# Patient Record
Sex: Female | Born: 1950 | Race: White | Hispanic: No | Marital: Married | Smoking: Former smoker
Health system: Southern US, Community
[De-identification: ages and names within clinical notes are randomized; demographics above are authoritative.]

## PROBLEM LIST (undated history)

## (undated) DIAGNOSIS — E65 Localized adiposity: Secondary | ICD-10-CM

## (undated) DIAGNOSIS — M810 Age-related osteoporosis without current pathological fracture: Secondary | ICD-10-CM

## (undated) DIAGNOSIS — I10 Essential (primary) hypertension: Secondary | ICD-10-CM

## (undated) DIAGNOSIS — E7800 Pure hypercholesterolemia, unspecified: Secondary | ICD-10-CM

## (undated) DIAGNOSIS — R0789 Other chest pain: Secondary | ICD-10-CM

## (undated) DIAGNOSIS — K219 Gastro-esophageal reflux disease without esophagitis: Secondary | ICD-10-CM

## (undated) DIAGNOSIS — M4316 Spondylolisthesis, lumbar region: Secondary | ICD-10-CM

## (undated) DIAGNOSIS — M5412 Radiculopathy, cervical region: Secondary | ICD-10-CM

## (undated) DIAGNOSIS — C4372 Malignant melanoma of left lower limb, including hip: Secondary | ICD-10-CM

## (undated) DIAGNOSIS — J45909 Unspecified asthma, uncomplicated: Secondary | ICD-10-CM

## (undated) DIAGNOSIS — M1990 Unspecified osteoarthritis, unspecified site: Secondary | ICD-10-CM

## (undated) HISTORY — PX: PARATHYROIDECTOMY: SHX19

## (undated) HISTORY — PX: ABDOMINAL HYSTERECTOMY: SHX81

## (undated) HISTORY — PX: LASIK: SHX215

## (undated) HISTORY — PX: BACK SURGERY: SHX140

## (undated) HISTORY — PX: HAND SURGERY: SHX662

## (undated) HISTORY — PX: CHOLECYSTECTOMY: SHX55

---

## 1995-07-13 HISTORY — PX: LAPAROSCOPIC HYSTERECTOMY: SHX1926

## 1998-07-12 DIAGNOSIS — M1990 Unspecified osteoarthritis, unspecified site: Secondary | ICD-10-CM | POA: Insufficient documentation

## 2007-08-22 ENCOUNTER — Ambulatory Visit: Payer: Self-pay | Admitting: Gastroenterology

## 2008-07-12 HISTORY — PX: GASTRIC ROUX-EN-Y: SHX5262

## 2010-07-12 HISTORY — PX: ANTERIOR FUSION CERVICAL SPINE: SUR626

## 2012-12-08 DIAGNOSIS — M542 Cervicalgia: Secondary | ICD-10-CM | POA: Insufficient documentation

## 2013-05-23 ENCOUNTER — Ambulatory Visit: Payer: Self-pay | Admitting: Family Medicine

## 2013-07-12 DIAGNOSIS — M5416 Radiculopathy, lumbar region: Secondary | ICD-10-CM | POA: Insufficient documentation

## 2014-05-12 DIAGNOSIS — G5702 Lesion of sciatic nerve, left lower limb: Secondary | ICD-10-CM | POA: Insufficient documentation

## 2015-06-02 DIAGNOSIS — I10 Essential (primary) hypertension: Secondary | ICD-10-CM | POA: Insufficient documentation

## 2015-06-02 DIAGNOSIS — G8929 Other chronic pain: Secondary | ICD-10-CM | POA: Insufficient documentation

## 2015-06-02 DIAGNOSIS — Z9884 Bariatric surgery status: Secondary | ICD-10-CM | POA: Insufficient documentation

## 2015-07-13 HISTORY — PX: CARPAL TUNNEL RELEASE: SHX101

## 2016-07-12 HISTORY — PX: CHOLECYSTECTOMY: SHX55

## 2017-06-10 ENCOUNTER — Other Ambulatory Visit: Payer: Self-pay | Admitting: Family Medicine

## 2017-06-10 DIAGNOSIS — R1310 Dysphagia, unspecified: Secondary | ICD-10-CM

## 2017-06-14 ENCOUNTER — Ambulatory Visit
Admission: RE | Admit: 2017-06-14 | Discharge: 2017-06-14 | Disposition: A | Discharge status: Home / Self Care | Payer: Medicare Other | Source: Ambulatory Visit | Attending: Family Medicine | Admitting: Family Medicine

## 2017-06-14 DIAGNOSIS — R1310 Dysphagia, unspecified: Secondary | ICD-10-CM | POA: Diagnosis not present

## 2017-12-15 HISTORY — PX: ESOPHAGOGASTRODUODENOSCOPY: SHX1529

## 2018-04-03 DIAGNOSIS — R0789 Other chest pain: Secondary | ICD-10-CM | POA: Insufficient documentation

## 2018-07-12 HISTORY — PX: ANKLE SURGERY: SHX546

## 2018-08-15 DIAGNOSIS — M7662 Achilles tendinitis, left leg: Secondary | ICD-10-CM | POA: Insufficient documentation

## 2018-08-15 DIAGNOSIS — M21862 Other specified acquired deformities of left lower leg: Secondary | ICD-10-CM | POA: Insufficient documentation

## 2019-04-17 DIAGNOSIS — L574 Cutis laxa senilis: Secondary | ICD-10-CM | POA: Insufficient documentation

## 2019-04-17 DIAGNOSIS — R634 Abnormal weight loss: Secondary | ICD-10-CM | POA: Insufficient documentation

## 2019-04-17 DIAGNOSIS — E65 Localized adiposity: Secondary | ICD-10-CM | POA: Insufficient documentation

## 2019-07-11 ENCOUNTER — Ambulatory Visit (INDEPENDENT_AMBULATORY_CARE_PROVIDER_SITE_OTHER): Payer: Medicare Other | Admitting: Podiatry

## 2019-07-11 ENCOUNTER — Other Ambulatory Visit: Payer: Self-pay

## 2019-07-11 DIAGNOSIS — L603 Nail dystrophy: Secondary | ICD-10-CM

## 2019-07-11 DIAGNOSIS — M810 Age-related osteoporosis without current pathological fracture: Secondary | ICD-10-CM | POA: Insufficient documentation

## 2019-07-11 NOTE — Progress Notes (Signed)
  Subjective:  Patient ID: Stephanie Jennings, female    DOB: 1951/07/11,  MRN: [id number] HPI No chief complaint on file.   68 y.o. female presents with the above complaint.   ROS: Denies fever chills nausea vomiting muscle aches pains calf pain back pain chest pain shortness of breath.  No past medical history on file.   Current Outpatient Medications:  .  amLODipine (NORVASC) 5 MG tablet, Take by mouth., Disp: , Rfl:  .  BIOTIN PO, Take by mouth., Disp: , Rfl:  .  Calcium Carbonate-Vitamin D (CALCIUM-VITAMIN D3 PO), Take by mouth., Disp: , Rfl:  .  Cyanocobalamin (VITAMIN B-12 PO), Take by mouth., Disp: , Rfl:  .  cycloSPORINE (RESTASIS) 0.05 % ophthalmic emulsion, 1 drop 2 (two) times daily., Disp: , Rfl:  .  denosumab (PROLIA) 60 MG/ML SOSY injection, Inject 60 mg into the skin every 6 (six) months., Disp: , Rfl:  .  Liraglutide -Weight Management (SAXENDA) 18 MG/3ML SOPN, , Disp: , Rfl:  .  methocarbamol (ROBAXIN) 750 MG tablet, Take 750 mg by mouth 4 (four) times daily., Disp: , Rfl:  .  Multiple Vitamin (MULTIVITAMIN) capsule, Take 1 capsule by mouth daily., Disp: , Rfl:  .  omeprazole (PRILOSEC) 20 MG capsule, TAKE 1 CAPSULE DAILY, Disp: , Rfl:  .  potassium chloride (KLOR-CON) 10 MEQ tablet, Take by mouth., Disp: , Rfl:  .  pregabalin (LYRICA) 100 MG capsule, Take by mouth., Disp: , Rfl:  .  rosuvastatin (CRESTOR) 10 MG tablet, Take by mouth., Disp: , Rfl:   Allergies  Allergen Reactions  . 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  General: Well developed, nourished, in no acute distress, alert and oriented x3   Dermatological: Skin is warm, dry and supple bilateral. Nails x 10 are well maintained; remaining integument appears unremarkable at this time. There are no open sores, no preulcerative lesions, no rash or signs of infection present.  Thick yellow dystrophic possibly mycotic nail plates hallux  bilateral  Vascular: Dorsalis Pedis artery and Posterior Tibial artery pedal pulses are 2/4 bilateral with immedate capillary fill time. Pedal hair growth present. No varicosities and no lower extremity edema present bilateral.   Neruologic: Grossly intact via light touch bilateral. Vibratory intact via tuning fork bilateral. Protective threshold with Semmes Wienstein monofilament intact to all pedal sites bilateral. Patellar and Achilles deep tendon reflexes 2+ bilateral. No Babinski or clonus noted bilateral.   Musculoskeletal: No gross boney pedal deformities bilateral. No pain, crepitus, or limitation noted with foot and ankle range of motion bilateral. Muscular strength 5/5 in all groups tested bilateral.  Gait: Unassisted, Nonantalgic.    Radiographs:  None taken  Assessment & Plan:   Assessment: Cannot rule out nail dystrophy or onychomycosis.  Plan: Samples of the nail were taken today to be sent for pathologic evaluation     Gailen Venne T. [place], [place]

## 2019-07-27 ENCOUNTER — Telehealth: Payer: Self-pay | Admitting: *Deleted

## 2019-07-27 NOTE — Telephone Encounter (Signed)
I informed pt of Dr. Geryl Rankins review of results and pt states understanding and will be in office to discuss care of toenails that are lifting up.

## 2019-07-27 NOTE — Telephone Encounter (Signed)
-----   Message from Elinor Parkinson, [place] sent at 07/26/2019 12:05 PM EST ----- Negative for fungus.

## 2019-08-15 ENCOUNTER — Ambulatory Visit: Payer: Medicare Other | Admitting: Podiatry

## 2019-09-05 ENCOUNTER — Ambulatory Visit (INDEPENDENT_AMBULATORY_CARE_PROVIDER_SITE_OTHER): Payer: Medicare Other | Admitting: Podiatry

## 2019-09-05 ENCOUNTER — Other Ambulatory Visit: Payer: Self-pay

## 2019-09-05 DIAGNOSIS — L603 Nail dystrophy: Secondary | ICD-10-CM

## 2019-09-05 NOTE — Progress Notes (Signed)
She presents today to discuss findings from her nail pathology.  Objective: Vital signs are stable alert and oriented x3 she is recovering from a retrocalcaneal heel spur resection performed by orthopedics.  No pathology report demonstrates no fungus no melanotic tissue.  Assessment: Negative onychomycosis.  Plan: Follow-up with me on an as-needed basis for onychocryptosis ingrown toenails.

## 2019-12-18 DIAGNOSIS — M5412 Radiculopathy, cervical region: Secondary | ICD-10-CM | POA: Insufficient documentation

## 2019-12-18 DIAGNOSIS — J386 Stenosis of larynx: Secondary | ICD-10-CM | POA: Insufficient documentation

## 2020-04-28 ENCOUNTER — Other Ambulatory Visit: Payer: Self-pay | Admitting: Otolaryngology

## 2020-04-28 DIAGNOSIS — H9123 Sudden idiopathic hearing loss, bilateral: Secondary | ICD-10-CM

## 2020-05-13 ENCOUNTER — Ambulatory Visit
Admission: RE | Admit: 2020-05-13 | Discharge: 2020-05-13 | Disposition: A | Discharge status: Home / Self Care | Payer: Medicare Other | Source: Ambulatory Visit | Attending: Otolaryngology | Admitting: Otolaryngology

## 2020-05-13 ENCOUNTER — Other Ambulatory Visit: Payer: Self-pay

## 2020-05-13 DIAGNOSIS — H9123 Sudden idiopathic hearing loss, bilateral: Secondary | ICD-10-CM | POA: Insufficient documentation

## 2020-05-13 MED ORDER — GADOBUTROL 1 MMOL/ML IV SOLN
8.0000 mL | Freq: Once | INTRAVENOUS | Status: AC | PRN
  Administered 2020-05-13: 8 mL via INTRAVENOUS

## 2021-02-17 ENCOUNTER — Encounter: Payer: Self-pay | Admitting: Internal Medicine

## 2021-02-18 ENCOUNTER — Encounter: Payer: Self-pay | Admitting: Internal Medicine

## 2021-02-18 ENCOUNTER — Encounter
Admission: RE | Disposition: A | Discharge status: Home / Self Care | Payer: Self-pay | Source: Home / Self Care | Attending: Internal Medicine

## 2021-02-18 ENCOUNTER — Ambulatory Visit: Payer: Medicare Other | Admitting: Certified Registered Nurse Anesthetist

## 2021-02-18 ENCOUNTER — Ambulatory Visit
Admission: RE | Admit: 2021-02-18 | Discharge: 2021-02-18 | Disposition: A | Discharge status: Home / Self Care | Payer: Medicare Other | Attending: Internal Medicine | Admitting: Internal Medicine

## 2021-02-18 DIAGNOSIS — E669 Obesity, unspecified: Secondary | ICD-10-CM | POA: Diagnosis not present

## 2021-02-18 DIAGNOSIS — K640 First degree hemorrhoids: Secondary | ICD-10-CM | POA: Diagnosis not present

## 2021-02-18 DIAGNOSIS — K5730 Diverticulosis of large intestine without perforation or abscess without bleeding: Secondary | ICD-10-CM | POA: Diagnosis not present

## 2021-02-18 DIAGNOSIS — Z87891 Personal history of nicotine dependence: Secondary | ICD-10-CM | POA: Diagnosis not present

## 2021-02-18 DIAGNOSIS — Z6831 Body mass index (BMI) 31.0-31.9, adult: Secondary | ICD-10-CM | POA: Diagnosis not present

## 2021-02-18 DIAGNOSIS — Z79899 Other long term (current) drug therapy: Secondary | ICD-10-CM | POA: Diagnosis not present

## 2021-02-18 DIAGNOSIS — Z1211 Encounter for screening for malignant neoplasm of colon: Secondary | ICD-10-CM | POA: Insufficient documentation

## 2021-02-18 DIAGNOSIS — Z886 Allergy status to analgesic agent status: Secondary | ICD-10-CM | POA: Insufficient documentation

## 2021-02-18 HISTORY — DX: Other chest pain: R07.89

## 2021-02-18 HISTORY — DX: Essential (primary) hypertension: I10

## 2021-02-18 HISTORY — DX: Age-related osteoporosis without current pathological fracture: M81.0

## 2021-02-18 HISTORY — DX: Localized adiposity: E65

## 2021-02-18 HISTORY — PX: COLONOSCOPY WITH PROPOFOL: SHX5780

## 2021-02-18 SURGERY — COLONOSCOPY WITH PROPOFOL
Anesthesia: General

## 2021-02-18 MED ORDER — PROPOFOL 500 MG/50ML IV EMUL
INTRAVENOUS | Status: AC
  Filled 2021-02-18: qty 50

## 2021-02-18 MED ORDER — SODIUM CHLORIDE 0.9 % IV SOLN
INTRAVENOUS | Status: DC
  Administered 2021-02-18: 1000 mL via INTRAVENOUS

## 2021-02-18 MED ORDER — PROPOFOL 500 MG/50ML IV EMUL
INTRAVENOUS | Status: DC | PRN
  Administered 2021-02-18: 75 ug/kg/min via INTRAVENOUS

## 2021-02-18 MED ORDER — PROPOFOL 10 MG/ML IV BOLUS
INTRAVENOUS | Status: DC | PRN
  Administered 2021-02-18: 50 mg via INTRAVENOUS

## 2021-02-18 MED ORDER — PHENYLEPHRINE HCL (PRESSORS) 10 MG/ML IV SOLN
INTRAVENOUS | Status: AC
  Filled 2021-02-18: qty 1

## 2021-02-18 MED ORDER — LIDOCAINE HCL (CARDIAC) PF 100 MG/5ML IV SOSY
PREFILLED_SYRINGE | INTRAVENOUS | Status: DC | PRN
  Administered 2021-02-18: 50 mg via INTRAVENOUS

## 2021-02-18 MED ORDER — PROPOFOL 10 MG/ML IV BOLUS
INTRAVENOUS | Status: AC
  Filled 2021-02-18: qty 40

## 2021-02-18 NOTE — H&P (Signed)
Outpatient short stay form Pre-procedure 02/18/2021 8:28 AM Gerilynn Mccullars K. Norma Fredrickson, M.D.  Primary Physician: Jerl Mina, M.D.  Reason for visit:  Colon cancer screening  History of present illness:  Patient presents for colonoscopy for colon cancer screening. The patient denies complaints of abdominal pain, significant change in bowel habits, or rectal bleeding.      Current Facility-Administered Medications:    0.9 %  sodium chloride infusion, , Intravenous, Continuous, Riley, Boykin Nearing, MD, Last Rate: 20 mL/hr at 02/18/21 0720, 1,000 mL at 02/18/21 0720  Medications Prior to Admission  Medication Sig Dispense Refill Last Dose   BIOTIN PO Take by mouth.   02/17/2021 at 0600   Calcium Carbonate-Vitamin D (CALCIUM-VITAMIN D3 PO) Take by mouth.   02/17/2021   cycloSPORINE (RESTASIS) 0.05 % ophthalmic emulsion 1 drop 2 (two) times daily.   02/17/2021   denosumab (PROLIA) 60 MG/ML SOSY injection Inject 60 mg into the skin every 6 (six) months.   Past Week   methocarbamol (ROBAXIN) 750 MG tablet Take 750 mg by mouth 4 (four) times daily.   02/17/2021   Multiple Vitamin (MULTIVITAMIN) capsule Take 1 capsule by mouth daily.   02/17/2021   omeprazole (PRILOSEC) 20 MG capsule TAKE 1 CAPSULE DAILY   02/18/2021 at 0600   potassium chloride (KLOR-CON) 10 MEQ tablet Take by mouth.   02/18/2021 at 0600   pregabalin (LYRICA) 100 MG capsule Take by mouth.   02/18/2021 at 0600   rosuvastatin (CRESTOR) 10 MG tablet Take by mouth.   02/17/2021   Semaglutide-Weight Management 1 MG/0.5ML SOAJ Inject 1 mg into the skin once a week.   02/17/2021   amLODipine (NORVASC) 5 MG tablet Take by mouth.      buPROPion (WELLBUTRIN SR) 200 MG 12 hr tablet Take 200 mg by mouth 2 (two) times daily. (Patient not taking: Reported on 02/18/2021)   Not Taking   Cyanocobalamin (VITAMIN B-12 PO) Take by mouth. 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    Physical Exam  Gen: Alert, oriented. Appears stated age.  HEENT: [place]/AT. PERRLA. Lungs: CTA, no wheezes. CV: RR nl S1, S2. Abd: soft, benign, no masses. BS+ Ext: No edema. Pulses 2+    Planned procedures: Proceed with colonoscopy. The patient understands the nature of the planned procedure, indications, risks, alternatives and potential complications including but not limited to bleeding, infection, perforation, damage to internal organs and possible oversedation/side effects from anesthesia. The patient agrees and gives consent to proceed.  Please refer to procedure notes for findings, recommendations and patient disposition/instructions.     Hanae Waiters K. Norma Fredrickson, M.D. Gastroenterology 02/18/2021  8:28 AM

## 2021-02-18 NOTE — Transfer of Care (Signed)
Immediate Anesthesia Transfer of Care Note  Patient: Stephanie Jennings  Procedure(s) Performed: COLONOSCOPY WITH PROPOFOL  Patient Location: PACU  Anesthesia Type:General  Level of Consciousness: drowsy  Airway & Oxygen Therapy: Patient Spontanous Breathing  Post-op Assessment: Report given to RN and Post -op Vital signs reviewed and stable  Post vital signs: Reviewed and stable  Last Vitals:  Vitals Value Taken Time  BP 116/66 02/18/21 0851  Temp    Pulse 65 02/18/21 0851  Resp 14 02/18/21 0851  SpO2 99 % 02/18/21 0851    Last Pain:  Vitals:   02/18/21 0706  TempSrc: Temporal  PainSc: 0-No pain         Complications: No notable events documented.

## 2021-02-18 NOTE — Anesthesia Preprocedure Evaluation (Signed)
Anesthesia Evaluation  Patient identified by MRN, date of birth, ID band Patient awake    Reviewed: Allergy & Precautions, NPO status , Patient's Chart, lab work & pertinent test results  History of Anesthesia Complications Negative for: history of anesthetic complications  Airway Mallampati: II  TM Distance: >3 FB Neck ROM: Full    Dental no notable dental hx.    Pulmonary neg pulmonary ROS, neg sleep apnea, neg COPD, former smoker,    breath sounds clear to auscultation- rhonchi (-) wheezing      Cardiovascular Exercise Tolerance: Good hypertension, Pt. on medications (-) CAD, (-) Past MI, (-) Cardiac Stents and (-) CABG  Rhythm:Regular Rate:Normal - Systolic murmurs and - Diastolic murmurs    Neuro/Psych neg Seizures negative neurological ROS  negative psych ROS   GI/Hepatic negative GI ROS, Neg liver ROS,   Endo/Other  negative endocrine ROSneg diabetes  Renal/GU negative Renal ROS     Musculoskeletal negative musculoskeletal ROS (+)   Abdominal (+) + obese,   Peds  Hematology negative hematology ROS (+)   Anesthesia Other Findings Past Medical History: No date: Chest pain, atypical No date: Hypertension No date: Osteoporosis No date: Pannus, abdominal   Reproductive/Obstetrics                             Anesthesia Physical Anesthesia Plan  ASA: 2  Anesthesia Plan: General   Post-op Pain Management:    Induction: Intravenous  PONV Risk Score and Plan: 2 and Propofol infusion  Airway Management Planned: Natural Airway  Additional Equipment:   Intra-op Plan:   Post-operative Plan:   Informed Consent: I have reviewed the patients History and Physical, chart, labs and discussed the procedure including the risks, benefits and alternatives for the proposed anesthesia with the patient or authorized representative who has indicated his/her understanding and acceptance.      Dental advisory given  Plan Discussed with: CRNA and Anesthesiologist  Anesthesia Plan Comments:         Anesthesia Quick Evaluation

## 2021-02-18 NOTE — Op Note (Signed)
[facility] Gastroenterology Patient Name: Stephanie Jennings Procedure Date: 02/18/2021 8:29 AM MRN: [id number] Account #: [id number] Date of Birth: September 25, 1950 Admit Type: Outpatient Age: 70 Room: [facility] ENDO ROOM 2 Gender: Female Note Status: Finalized Procedure:             Colonoscopy Indications:           Screening for colorectal malignant neoplasm Providers:             Boykin Nearing. Norma Fredrickson MD, MD Referring MD:          Rhona Leavens. Burnett Sheng, MD (Referring MD) Medicines:             Propofol per Anesthesia Complications:         No immediate complications. Procedure:             Pre-Anesthesia Assessment:                        - The risks and benefits of the procedure and the                         sedation options and risks were discussed with the                         patient. All questions were answered and informed                         consent was obtained.                        - Patient identification and proposed procedure were                         verified prior to the procedure by the nurse. The                         procedure was verified in the procedure room.                        - ASA Grade Assessment: III - A patient with severe                         systemic disease.                        - After reviewing the risks and benefits, the patient                         was deemed in satisfactory condition to undergo the                         procedure.                        After obtaining informed consent, the colonoscope was                         passed under direct vision. Throughout the procedure,                         the patient's blood pressure,  pulse, and oxygen                         saturations were monitored continuously. The                         Colonoscope was introduced through the anus and                         advanced to the the cecum, identified by appendiceal                         orifice and ileocecal  valve. The colonoscopy was                         performed without difficulty. The patient tolerated                         the procedure well. The quality of the bowel                         preparation was good. The ileocecal valve, appendiceal                         orifice, and rectum were photographed. Findings:      The perianal and digital rectal examinations were normal. Pertinent       negatives include normal sphincter tone and no palpable rectal lesions.      Non-bleeding internal hemorrhoids were found during retroflexion. The       hemorrhoids were Grade I (internal hemorrhoids that do not prolapse).      Many small-mouthed diverticula were found in the sigmoid colon. There       was no evidence of diverticular bleeding.      The exam was otherwise without abnormality. Impression:            - Non-bleeding internal hemorrhoids.                        - Mild diverticulosis in the sigmoid colon. There was                         no evidence of diverticular bleeding.                        - The examination was otherwise normal.                        - No specimens collected. Recommendation:        - Patient has a contact number available for                         emergencies. The signs and symptoms of potential                         delayed complications were discussed with the patient.                         Return to normal activities tomorrow. Written  discharge instructions were provided to the patient.                        - Resume previous diet.                        - Continue present medications.                        - Repeat colonoscopy in 10 years for screening                         purposes.                        - Return to GI office PRN.                        - The findings and recommendations were discussed with                         the patient. Procedure Code(s):     --- Professional ---                         I3474, Colorectal cancer screening; colonoscopy on                         individual not meeting criteria for high risk Diagnosis Code(s):     --- Professional ---                        K57.30, Diverticulosis of large intestine without                         perforation or abscess without bleeding                        K64.0, First degree hemorrhoids                        Z12.11, Encounter for screening for malignant neoplasm                         of colon CPT copyright 2019 American Medical Association. All rights reserved. The codes documented in this report are preliminary and upon coder review may  be revised to meet current compliance requirements. Stanton Kidney MD, MD 02/18/2021 8:48:40 AM This report has been signed electronically. Number of Addenda: 0 Note Initiated On: 02/18/2021 8:29 AM Scope Withdrawal Time: 0 hours 6 minutes 3 seconds  Total Procedure Duration: 0 hours 12 minutes 3 seconds  Estimated Blood Loss:  Estimated blood loss: none.      [facility], [place]

## 2021-02-18 NOTE — Anesthesia Postprocedure Evaluation (Signed)
Anesthesia Post Note  Patient: Stephanie Jennings  Procedure(s) Performed: COLONOSCOPY WITH PROPOFOL  Patient location during evaluation: Endoscopy Anesthesia Type: General Level of consciousness: awake and alert and oriented Pain management: pain level controlled Vital Signs Assessment: post-procedure vital signs reviewed and stable Respiratory status: spontaneous breathing, nonlabored ventilation and respiratory function stable Cardiovascular status: blood pressure returned to baseline and stable Postop Assessment: no signs of nausea or vomiting Anesthetic complications: no   No notable events documented.   Last Vitals:  Vitals:   02/18/21 0900 02/18/21 0910  BP: 120/81 129/71  Pulse: 66 (!) 55  Resp: 20 15  Temp:    SpO2: 100% 100%    Last Pain:  Vitals:   02/18/21 0910  TempSrc:   PainSc: 0-No pain                 Yazan Gatling

## 2021-02-18 NOTE — Interval H&P Note (Signed)
History and Physical Interval Note:  02/18/2021 8:29 AM  Stephanie Jennings  has presented today for surgery, with the diagnosis of screening.  The various methods of treatment have been discussed with the patient and family. After consideration of risks, benefits and other options for treatment, the patient has consented to  Procedure(s): COLONOSCOPY WITH PROPOFOL (N/A) as a surgical intervention.  The patient's history has been reviewed, patient examined, no change in status, stable for surgery.  I have reviewed the patient's chart and labs.  Questions were answered to the patient's satisfaction.     [place], [place]

## 2021-02-19 ENCOUNTER — Encounter: Payer: Self-pay | Admitting: Internal Medicine

## 2021-08-12 HISTORY — PX: POSTERIOR LUMBAR FUSION: SHX6036

## 2021-08-13 DIAGNOSIS — M4316 Spondylolisthesis, lumbar region: Secondary | ICD-10-CM | POA: Insufficient documentation

## 2021-12-02 ENCOUNTER — Ambulatory Visit (LOCAL_COMMUNITY_HEALTH_CENTER): Payer: Medicare Other

## 2021-12-02 DIAGNOSIS — Z719 Counseling, unspecified: Secondary | ICD-10-CM

## 2021-12-02 DIAGNOSIS — Z23 Encounter for immunization: Secondary | ICD-10-CM | POA: Diagnosis not present

## 2021-12-02 NOTE — Progress Notes (Signed)
Patient here for COVID-19 BV booster. Reported they received Moderna BV last time but all other vaccines were Pfizer and would like [employer] today.   EUA sheet given for review.     Are you feeling sick today? No   Have you ever received a dose of COVID-19 Vaccine? [employer], [place], [place], [place], Other) Yes  If yes, which vaccine and how many doses?    4 Pfizer monovalent, 1 moderna BV   Did you bring the vaccination record card or other documentation?  Yes   Do you have a health condition or are undergoing treatment that makes you moderately or severely immunocompromised? This would include, but not be limited to: cancer, HIV, organ transplant, immunosuppressive therapy/high-dose corticosteroids, or moderate/severe primary immunodeficiency.  No, reports she has one medicine once a month, that has a chance to make immunocompromised.    Have you received COVID-19 vaccine before or during hematopoietic cell transplant (HCT) or CAR-T-cell therapies? No  Have you ever had an allergic reaction to: (This would include a severe allergic reaction or a reaction that caused hives, swelling, or respiratory distress, including wheezing.) A component of a COVID-19 vaccine or a previous dose of COVID-19 vaccine? No   Have you ever had an allergic reaction to another vaccine (other thanCOVID-19 vaccine) or an injectable medication? (This would include a severe allergic reaction or a reaction that caused hives, swelling, or respiratory distress, including wheezing.)   No    Do you have a history of any of the following:  Myocarditis or Pericarditis No  Dermal fillers:  No  Multisystem Inflammatory Syndrome (MIS-C or MIS-A)? No  COVID-19 disease within the past 3 months? No  Vaccinated with monkeypox vaccine in the last 4 weeks? No   Vaccine administered, tolerated well.  After vaccine care reviewed.  COVID-19 card given and [facility] updated with married name and address.    Hildegard Hlavac Sherrilyn Rist, RN

## 2022-02-03 IMAGING — MR MR BRAIN/IAC WO/W CM
11 of 14 series · 32 of 48 positions shown · IV contrast (gadavist)
Comparison: None.

CLINICAL DATA: One year of hearing loss, worsening for 3 months.

EXAM:
MRI HEAD WITHOUT AND WITH CONTRAST
TECHNIQUE: Multiplanar, multiecho pulse sequences of the brain and surrounding
structures were obtained without and with intravenous contrast.
CONTRAST:  8mL GADAVIST GADOBUTROL 1 MMOL/ML IV SOLN

[Series 5: T1 · sagittal · 5.0mm · 0.62mm/px · 1 of 25 slices shown (1 of 4)]
[im 1/25]
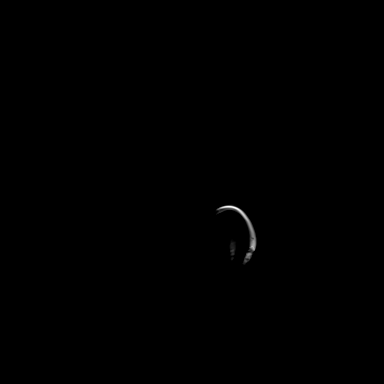

[Series 6: ax dwi_tracew · axial · 3.0mm · 0.60mm/px · z∈[-121,+32]mm · 2 of 48 slices shown]
[im 1/48]
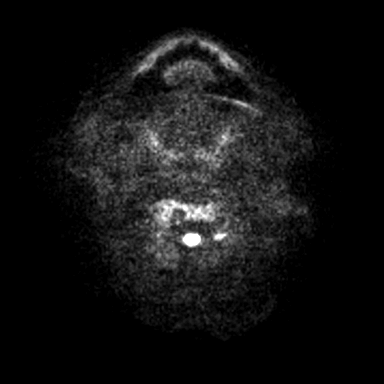
[im 48/48]
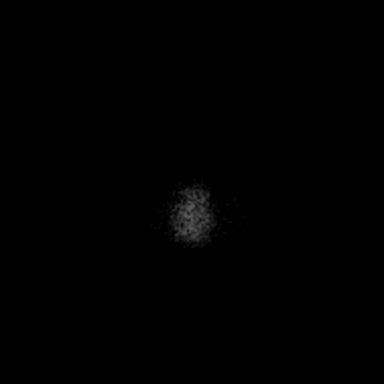

[Series 7: ax dwi_adc · axial · 3.0mm · 0.60mm/px · 1 of 48 slices shown]
[im 1/48]
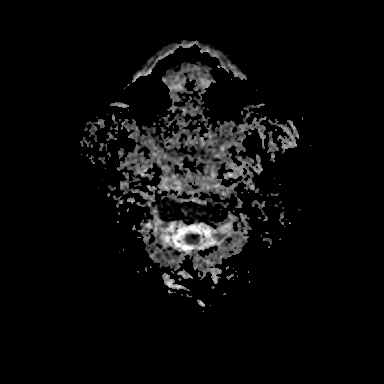

[Series 8: T2 · axial · 5.0mm · 0.53mm/px · z∈[-115,+28]mm · 2 of 25 slices shown]
[im 1/25]
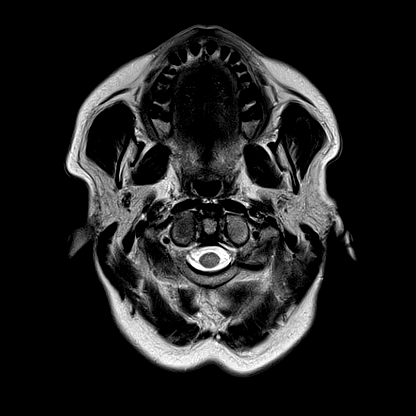
[im 25/25]
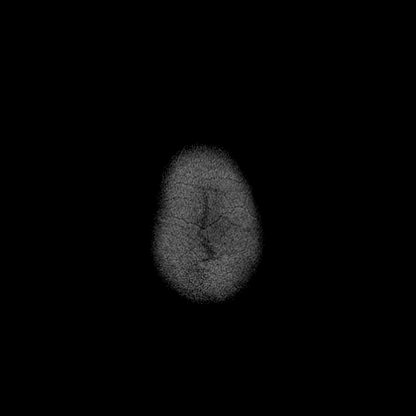

[Series 13: FLAIR · axial · 3.0mm · 0.53mm/px · z∈[-124,+37]mm · 3 of 55 slices shown]
[im 1/55]
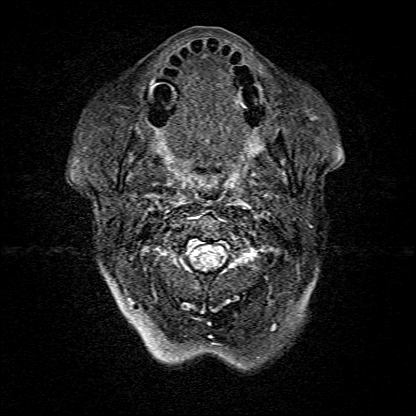
[im 28/55]
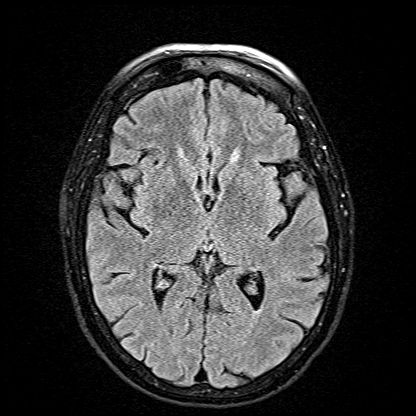
[im 55/55]
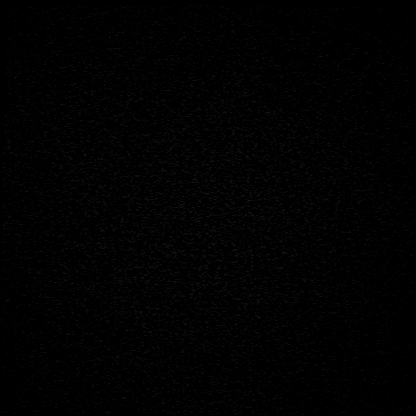

[Series 15: T1 · axial · non-contrast · 3.0mm · 0.21mm/px · 1 of 15 slices shown (2 of 4)]
[im 1/15]
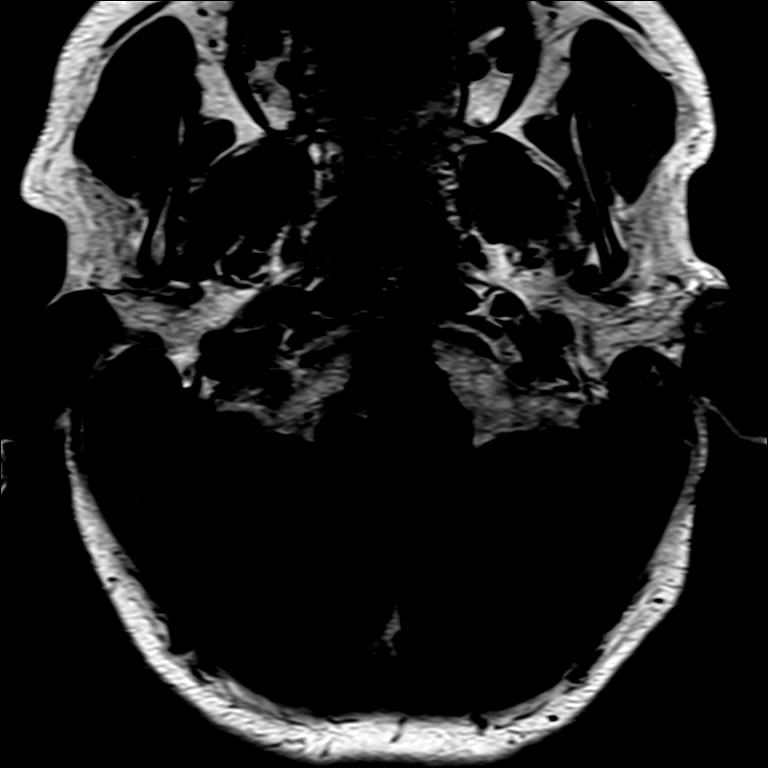

[Series 16: T1 · coronal · non-contrast · 3.0mm · 0.21mm/px · 1 of 13 slices shown (3 of 4)]
[im 1/13]
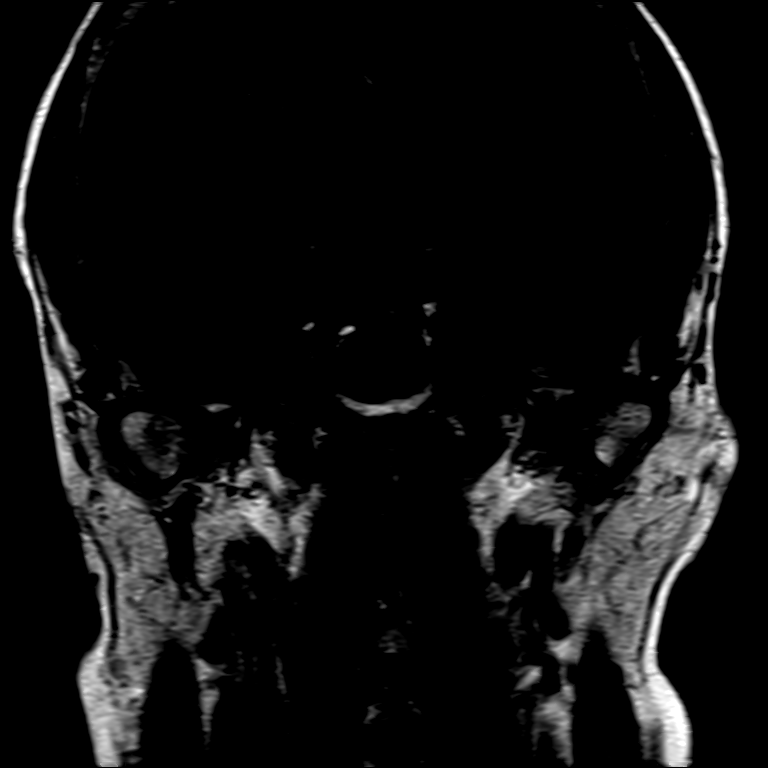

[Series 17: T1 · axial · 1.0mm · 0.98mm/px · z∈[-132,+41]mm · 8 of 176 slices shown (4 of 4)]
[im 1/176]
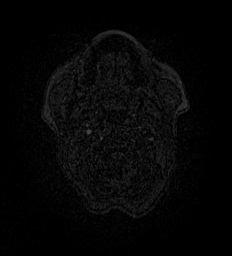
[im 36/176]
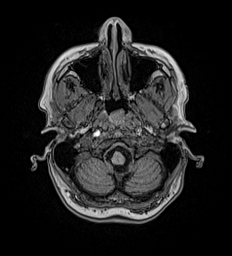
[im 53/176]
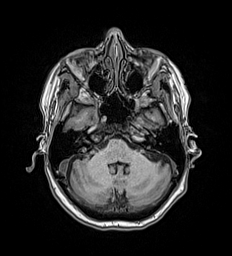
[im 71/176]
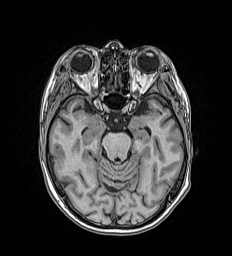
[im 106/176]
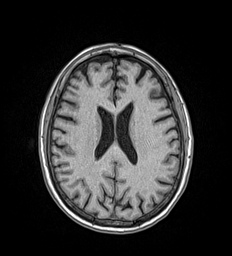
[im 123/176]
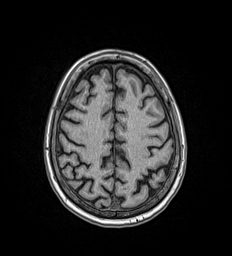
[im 141/176]
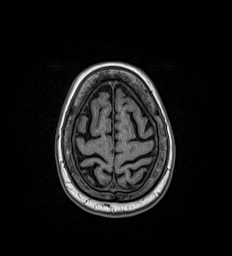
[im 176/176]
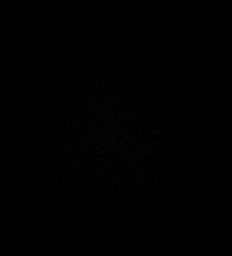

[Series 18: T1 post-contrast · axial · 3.0mm · 0.21mm/px · 1 of 15 slices shown (1 of 3)]
[im 1/15]
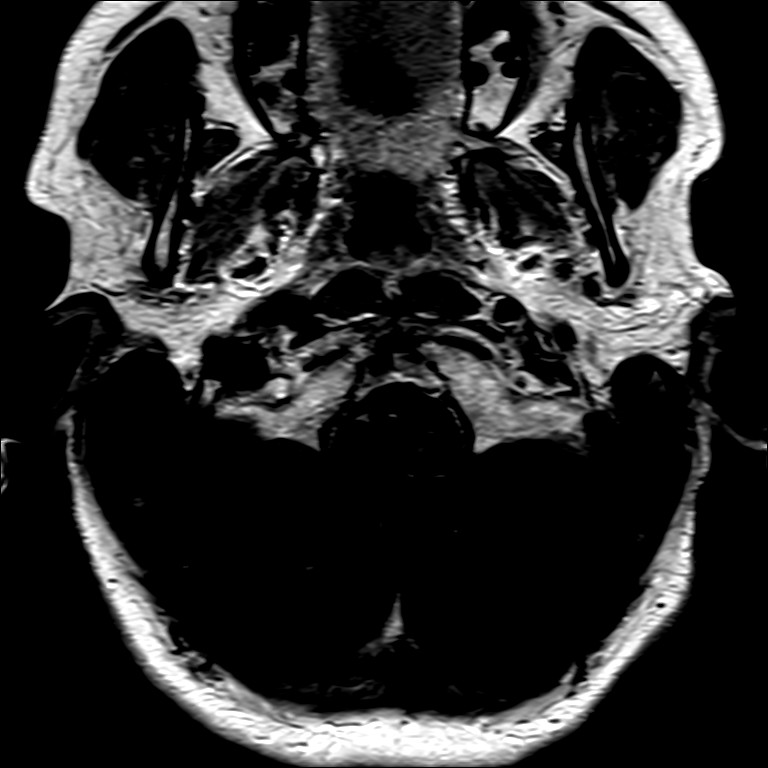

[Series 19: T1 post-contrast · coronal · 3.0mm · 0.21mm/px · 1 of 13 slices shown (2 of 3)]
[im 1/13]
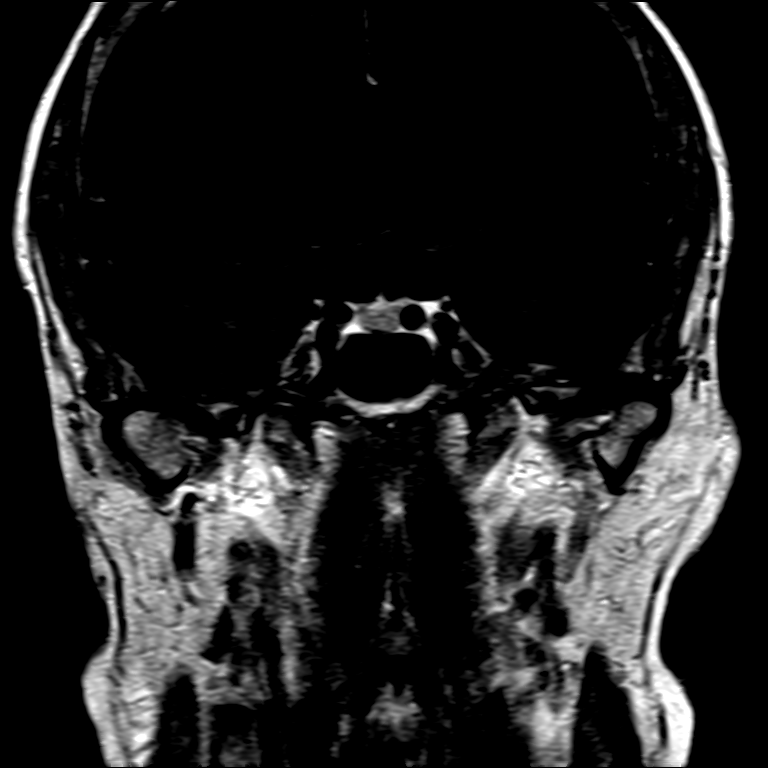

[Series 20: T1 post-contrast · axial · 1.0mm · 0.98mm/px · z∈[-132,+41]mm · 11 of 176 slices shown (3 of 3)]
[im 1/176]
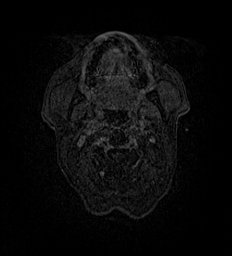
[im 18/176]
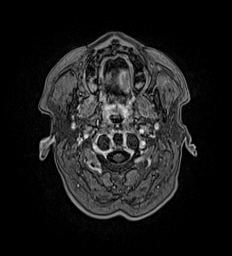
[im 36/176]
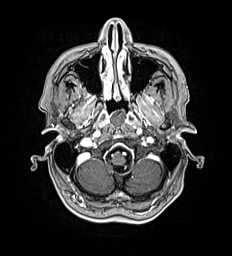
[im 53/176]
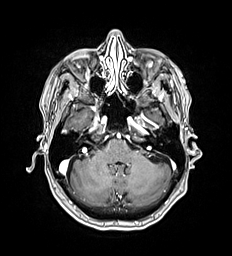
[im 71/176]
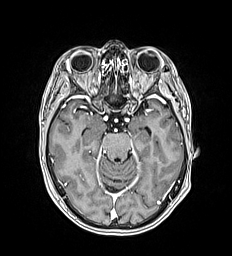
[im 88/176]
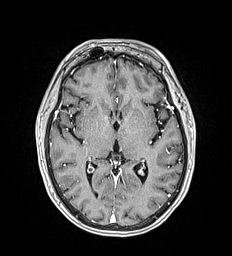
[im 106/176]
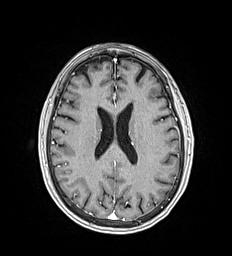
[im 123/176]
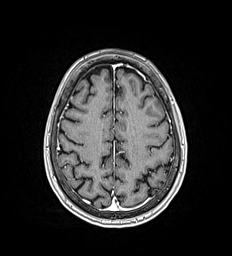
[im 141/176]
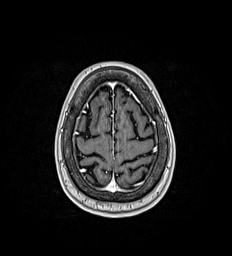
[im 158/176]
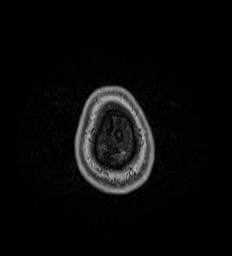
[im 176/176]
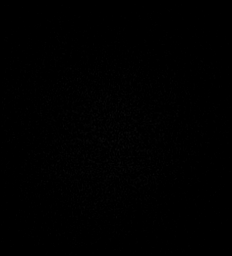

[32 of 48 positions shown; findings below may reference images not displayed]

FINDINGS: Brain: Symmetric normal labyrinthine signal. No abnormal enhancement
at the temporal bones or internal auditory canal. Negative cisterns
and brainstem.

Few remote white matter insults, commonly seen at patient age.
Normal brain volume. No hydrocephalus, collection, or mass.

Vascular: Normal flow voids and vascular enhancements. Small
diverticula at the bilateral jugular bulb, greater on the right.

Skull and upper cervical spine: Negative

Sinuses/Orbits: Negative

Other: Nonenhancing midline nasopharyngeal mass consistent with
retention cyst, 13 mm in diameter
IMPRESSION: 1. Negative for retrocochlear lesion.
2. Bilateral jugular bulb diverticulum.

## 2022-11-10 ENCOUNTER — Other Ambulatory Visit (HOSPITAL_COMMUNITY): Payer: Self-pay

## 2022-12-12 NOTE — Discharge Instructions (Signed)
Instructions after Total Hip Replacement   Belal Scallon P. Kathrynn Backstrom, Jr., M.D.    Dept. of Orthopaedics & Sports Medicine [facility] [address] [place], [place]  [address]  Phone: [phone]   Fax: [phone]        [email or web address]        DIET: Drink plenty of non-alcoholic fluids. Resume your normal diet. Include foods high in fiber.  ACTIVITY:  You may use crutches or a walker with weight-bearing as tolerated, unless instructed otherwise. You may be weaned off of the walker or crutches by your Physical Therapist.  Do NOT reach below the level of your knees or cross your legs until allowed.    Continue doing gentle exercises. Exercising will reduce the pain and swelling, increase motion, and prevent muscle weakness.   Please continue to use the TED compression stockings for 6 weeks. You may remove the stockings at night, but should reapply them in the morning. Do not drive or operate any equipment until instructed.  WOUND CARE:  Continue to use ice packs periodically to reduce pain and swelling. Keep the incision clean and dry. You may bathe or shower after the staples are removed at the first office visit following surgery.  MEDICATIONS: You may resume your regular medications. Please take the pain medication as prescribed on the medication. Do not take pain medication on an empty stomach. You have been given a prescription for a blood thinner to prevent blood clots. Please take the medication as instructed. (NOTE: After completing a 2 week course of Lovenox, take one Enteric-coated aspirin once a day.) Pain medications and iron supplements can cause constipation. Use a stool softener (Senokot or Colace) on a daily basis and a laxative (dulcolax or miralax) as needed. Do not drive or drink alcoholic beverages when taking pain medications.  CALL THE OFFICE FOR: Temperature above 101 degrees Excessive bleeding or drainage on the dressing. Excessive swelling,  coldness, or paleness of the toes. Persistent nausea and vomiting.  FOLLOW-UP:  You should have an appointment to return to the office in 6 weeks after surgery. Arrangements have been made for continuation of Physical Therapy (either home therapy or outpatient therapy).     [facility] Department Directory         [email or web address]       [email or web address]          Cardiology  Appointments: [place] - [phone] [place] - [phone]  Endocrinology  Appointments: [place] - [phone] [place] - [phone]  Gastroenterology  Appointments: [place] - [phone] [place] - [phone]        General Surgery   Appointments: [place] - [phone]  Internal Medicine/Family Medicine  Appointments: [place] - [phone] [place] - [phone] [place] - [phone]  Metabolic and Weigh Loss Surgery  Appointments: [place] - [phone]        Neurology  Appointments: [place] - [phone] [place] - [phone]  Neurosurgery  Appointments: [place] - [phone]  Obstetrics & Gynecology  Appointments: [place] - [phone] [place] - [phone]        Pediatrics  Appointments: [place] - [phone] [place] - [phone]  Physiatry  Appointments: [place] -[phone]  Physical Therapy  Appointments: [place] - [phone] [place] - [phone]        Podiatry  Appointments: [place] - [phone] [place] - [phone]  Pulmonology  Appointments: [place] - [phone]  Rheumatology  Appointments: [place] - [phone]        [place] Location: [facility]  [address] [place], [place]  [address]  [place] Location: [facility] [address] [place], [place]  [address]  [place] Location: [facility] [address] [place], [place]  [address]    

## 2022-12-14 ENCOUNTER — Encounter
Admission: RE | Admit: 2022-12-14 | Discharge: 2022-12-14 | Disposition: A | Discharge status: Home / Self Care | Payer: Medicare Other | Source: Ambulatory Visit | Attending: Orthopedic Surgery | Admitting: Orthopedic Surgery

## 2022-12-14 DIAGNOSIS — Z01818 Encounter for other preprocedural examination: Secondary | ICD-10-CM | POA: Diagnosis present

## 2022-12-14 HISTORY — DX: Malignant melanoma of left lower limb, including hip: C43.72

## 2022-12-14 HISTORY — DX: Spondylolisthesis, lumbar region: M43.16

## 2022-12-14 HISTORY — DX: Unspecified asthma, uncomplicated: J45.909

## 2022-12-14 HISTORY — DX: Essential (primary) hypertension: I10

## 2022-12-14 HISTORY — DX: Unspecified osteoarthritis, unspecified site: M19.90

## 2022-12-14 HISTORY — DX: Gastro-esophageal reflux disease without esophagitis: K21.9

## 2022-12-14 HISTORY — DX: Radiculopathy, cervical region: M54.12

## 2022-12-14 HISTORY — DX: Pure hypercholesterolemia, unspecified: E78.00

## 2022-12-14 LAB — URINALYSIS, ROUTINE W REFLEX MICROSCOPIC
Bilirubin Urine: NEGATIVE
Glucose, UA: NEGATIVE mg/dL
Hgb urine dipstick: NEGATIVE
Ketones, ur: 5 mg/dL — AB
Leukocytes,Ua: NEGATIVE
Nitrite: NEGATIVE
Protein, ur: NEGATIVE mg/dL
Specific Gravity, Urine: 1.011 (ref 1.005–1.030)
pH: 5 (ref 5.0–8.0)

## 2022-12-14 LAB — COMPREHENSIVE METABOLIC PANEL
ALT: 26 U/L (ref 0–44)
AST: 29 U/L (ref 15–41)
Albumin: 3.7 g/dL (ref 3.5–5.0)
Alkaline Phosphatase: 70 U/L (ref 38–126)
Anion gap: 9 (ref 5–15)
BUN: 20 mg/dL (ref 8–23)
CO2: 26 mmol/L (ref 22–32)
Calcium: 9 mg/dL (ref 8.9–10.3)
Chloride: 106 mmol/L (ref 98–111)
Creatinine, Ser: 0.71 mg/dL (ref 0.44–1.00)
GFR, Estimated: >60 mL/min (ref >60)
Glucose, Bld: 89 mg/dL (ref 70–99)
Potassium: 3.9 mmol/L (ref 3.5–5.1)
Sodium: 141 mmol/L (ref 135–145)
Total Bilirubin: 0.8 mg/dL (ref 0.3–1.2)
Total Protein: 6.4 g/dL — ABNORMAL LOW (ref 6.5–8.1)

## 2022-12-14 LAB — SURGICAL PCR SCREEN
MRSA, PCR: NEGATIVE
Staphylococcus aureus: NEGATIVE

## 2022-12-14 LAB — TYPE AND SCREEN
ABO/RH(D): A POS
Antibody Screen: NEGATIVE

## 2022-12-14 LAB — HEMOGLOBIN AND HEMATOCRIT, BLOOD
HCT: 40 % (ref 36.0–46.0)
Hemoglobin: 13.2 g/dL (ref 12.0–15.0)

## 2022-12-14 LAB — SEDIMENTATION RATE: Sed Rate: 7 mm/hr (ref 0–30)

## 2022-12-14 NOTE — Patient Instructions (Addendum)
Your procedure is scheduled on: Monday, June 17 Report to the Registration Desk on the 1st floor of the [facility] To find out your arrival time, please call [phone] between 1PM - 3PM on: Friday, June 14 If your arrival time is 6:00 am, do not arrive before that time as the [facility] entrance doors do not open until 6:00 am.  REMEMBER: Instructions that are not followed completely may result in serious medical risk, up to and including death; or upon the discretion of your surgeon and anesthesiologist your surgery may need to be rescheduled.  Do not eat food after midnight the night before surgery.  No gum chewing or hard candies.  You may however, drink CLEAR liquids up to 2 hours before you are scheduled to arrive for your surgery. Do not drink anything within 2 hours of your scheduled arrival time.  Clear liquids include: - water  - apple juice without pulp - gatorade (not RED colors) - black coffee or tea (Do NOT add milk or creamers to the coffee or tea) Do NOT drink anything that is not on this list.  In addition, your doctor has ordered for you to drink the provided:  Ensure Pre-Surgery Clear Carbohydrate Drink  Drinking this carbohydrate drink up to two hours before surgery helps to reduce insulin resistance and improve patient outcomes. Please complete drinking 2 hours before scheduled arrival time.  One week prior to surgery: starting June 10 Stop Anti-inflammatories (NSAIDS) such as Advil, Aleve, Ibuprofen, Motrin, Naproxen, Naprosyn and Aspirin based products such as Excedrin, Goody's Powder, BC Powder. Stop ANY OVER THE COUNTER supplements until after surgery. Stop multiple vitamins, melatonin, hair complex, calcium, probiotic. You may however, continue to take Tylenol if needed for pain up until the day of surgery.  Continue taking all prescribed medications with the exception of the following:  Semaglutide [facility]) hold for 7 days before surgery. Do NOT take  Wegovy on Saturday, June 15. Resume AFTER surgery on your regular day, Saturday, June 22.   TAKE ONLY THESE MEDICATIONS THE MORNING OF SURGERY WITH A SIP OF WATER:  Amlodipine Omeprazole (Prilosec) - (take one the night before and one on the morning of surgery - helps to prevent nausea after surgery.) Pregabalin (Lyrica)  No Alcohol for 24 hours before or after surgery.  No Smoking including e-cigarettes for 24 hours before surgery.  No chewable tobacco products for at least 6 hours before surgery.  No nicotine patches on the day of surgery.  Do not use any "recreational" drugs for at least a week (preferably 2 weeks) before your surgery.  Please be advised that the combination of cocaine and anesthesia may have negative outcomes, up to and including death. If you test positive for cocaine, your surgery will be cancelled.  On the morning of surgery brush your teeth with toothpaste and water, you may rinse your mouth with mouthwash if you wish. Do not swallow any toothpaste or mouthwash.  Use CHG Soap as directed on instruction sheet.  Do not wear jewelry, make-up, hairpins, clips or nail polish.  Do not wear lotions, powders, or perfumes.   Do not shave body hair from the neck down 48 hours before surgery.  Contact lenses, hearing aids and dentures may not be worn into surgery.  Do not bring valuables to the hospital. [facility] is not responsible for any missing/lost belongings or valuables.   Notify your doctor if there is any change in your medical condition (cold, fever, infection).  Wear  comfortable clothing (specific to your surgery type) to the hospital.  After surgery, you can help prevent lung complications by doing breathing exercises.  Take deep breaths and cough every 1-2 hours. Your doctor may order a device called an Incentive Spirometer to help you take deep breaths.  If you are being admitted to the hospital overnight, leave your suitcase in the car. After  surgery it may be brought to your room.  In case of increased patient census, it may be necessary for you, the patient, to continue your postoperative care in the Same Day Surgery department.  If you are being discharged the day of surgery, you will not be allowed to drive home. You will need a responsible individual to drive you home and stay with you for 24 hours after surgery.   If you are taking public transportation, you will need to have a responsible individual with you.  Please call the Pre-admissions Testing Dept. at [phone] if you have any questions about these instructions.  Surgery Visitation Policy:  Patients having surgery or a procedure may have two visitors.  Children under the age of 55 must have an adult with them who is not the patient.  Inpatient Visitation:    Visiting hours are 7 a.m. to 8 p.m. Up to four visitors are allowed at one time in a patient room. The visitors may rotate out with other people during the day.  One visitor age 46 or older may stay with the patient overnight and must be in the room by 8 p.m.    Pre-operative 5 CHG Bath Instructions   You can play a key role in reducing the risk of infection after surgery. Your skin needs to be as free of germs as possible. You can reduce the number of germs on your skin by washing with CHG (chlorhexidine gluconate) soap before surgery. CHG is an antiseptic soap that kills germs and continues to kill germs even after washing.   DO NOT use if you have an allergy to chlorhexidine/CHG or antibacterial soaps. If your skin becomes reddened or irritated, stop using the CHG and notify one of our RNs at [phone].   Please shower with the CHG soap starting 4 days before surgery using the following schedule:     Please keep in mind the following:  DO NOT shave, including legs and underarms, starting the day of your first shower.   You may shave your face at any point before/day of surgery.  Place clean  sheets on your bed the day you start using CHG soap. Use a clean washcloth (not used since being washed) for each shower. DO NOT sleep with pets once you start using the CHG.   CHG Shower Instructions:  If you choose to wash your hair and private area, wash first with your normal shampoo/soap.  After you use shampoo/soap, rinse your hair and body thoroughly to remove shampoo/soap residue.  Turn the water OFF and apply about 3 tablespoons (45 ml) of CHG soap to a CLEAN washcloth.  Apply CHG soap ONLY FROM YOUR NECK DOWN TO YOUR TOES (washing for 3-5 minutes)  DO NOT use CHG soap on face, private areas, open wounds, or sores.  Pay special attention to the area where your surgery is being performed.  If you are having back surgery, having someone wash your back for you may be helpful. Wait 2 minutes after CHG soap is applied, then you may rinse off the CHG soap.  Pat dry with a  clean towel  Put on clean clothes/pajamas   If you choose to wear lotion, please use ONLY the CHG-compatible lotions on the back of this paper.     Additional instructions for the day of surgery: DO NOT APPLY any lotions, deodorants, cologne, or perfumes.   Put on clean/comfortable clothes.  Brush your teeth.  Ask your nurse before applying any prescription medications to the skin.      CHG Compatible Lotions   Aveeno Moisturizing lotion  Cetaphil Moisturizing Cream  Cetaphil Moisturizing Lotion  Clairol Herbal Essence Moisturizing Lotion, Dry Skin  Clairol Herbal Essence Moisturizing Lotion, Extra Dry Skin  Clairol Herbal Essence Moisturizing Lotion, Normal Skin  Curel Age Defying Therapeutic Moisturizing Lotion with Alpha Hydroxy  Curel Extreme Care Body Lotion  Curel Soothing Hands Moisturizing Hand Lotion  Curel Therapeutic Moisturizing Cream, Fragrance-Free  Curel Therapeutic Moisturizing Lotion, Fragrance-Free  Curel Therapeutic Moisturizing Lotion, Original Formula  Eucerin Daily Replenishing  Lotion  Eucerin Dry Skin Therapy Plus Alpha Hydroxy C[place]e  Eucerin Dry Skin Therapy Plus Alpha Hydroxy Lotion  Eucerin Original Crme  Eucerin Original Lotion  Eucerin Plus Crme Eucerin Plus Lotion  Eucerin TriLipid Replenishing Lotion  Keri Anti-Bacterial Hand Lotion  Keri Deep Conditioning Original Lotion Dry Skin Formula Softly Scented  Keri Deep Conditioning Original Lotion, Fragrance Free Sensitive Skin Formula  Keri Lotion Fast Absorbing Fragrance Free Sensitive Skin Formula  Keri Lotion Fast Absorbing Softly Scented Dry Skin Formula  Keri Original Lotion  Keri Skin Renewal Lotion Keri Silky Smooth Lotion  Keri Silky Smooth Sensitive Skin Lotion  Nivea Body Creamy Conditioning Oil  Nivea Body Extra Enriched Lotion  Nivea Body Original Lotion  Nivea Body Sheer Moisturizing Lotion Nivea C[facility]e  Nivea Skin Firming Lotion  NutraDerm 30 Skin Lotion  NutraDerm Skin Lotion  NutraDerm Therapeutic Skin Cream  NutraDerm Therapeutic Skin Lotion  ProShield Protective Hand Cream  Provon moisturizing lotion  Preoperative Educational Videos for Total Hip, Knee and Shoulder Replacements  To better prepare for surgery, please view our videos that explain the physical activity and discharge planning required to have the best surgical recovery at [facility].  [email or web address]  Questions? Call [phone] or email [email or web address]

## 2022-12-15 LAB — C-REACTIVE PROTEIN: CRP: 0.6 mg/dL (ref <1.0)

## 2022-12-26 MED ORDER — LACTATED RINGERS IV SOLN: INTRAVENOUS | Status: DC

## 2022-12-26 MED ORDER — CHLORHEXIDINE GLUCONATE 0.12 % MT SOLN
15.0000 mL | Freq: Once | OROMUCOSAL | Status: AC
  Administered 2022-12-27: 15 mL via OROMUCOSAL

## 2022-12-26 MED ORDER — CEFAZOLIN SODIUM-DEXTROSE 2-4 GM/100ML-% IV SOLN
2.0000 g | INTRAVENOUS | Status: AC
  Administered 2022-12-27: 2 g via INTRAVENOUS

## 2022-12-26 MED ORDER — ORAL CARE MOUTH RINSE: 15.0000 mL | Freq: Once | OROMUCOSAL | Status: AC

## 2022-12-26 MED ORDER — DEXAMETHASONE SODIUM PHOSPHATE 10 MG/ML IJ SOLN
8.0000 mg | Freq: Once | INTRAMUSCULAR | Status: AC
  Administered 2022-12-27: 8 mg via INTRAVENOUS

## 2022-12-26 MED ORDER — GABAPENTIN 300 MG PO CAPS
300.0000 mg | ORAL_CAPSULE | Freq: Once | ORAL | Status: AC
  Administered 2022-12-27: 300 mg via ORAL

## 2022-12-26 MED ORDER — TRANEXAMIC ACID-NACL 1000-0.7 MG/100ML-% IV SOLN
1000.0000 mg | INTRAVENOUS | Status: AC
  Administered 2022-12-27: 1000 mg via INTRAVENOUS

## 2022-12-26 MED ORDER — CHLORHEXIDINE GLUCONATE 4 % EX SOLN: 60.0000 mL | Freq: Once | CUTANEOUS | Status: DC

## 2022-12-27 ENCOUNTER — Other Ambulatory Visit: Payer: Self-pay

## 2022-12-27 ENCOUNTER — Encounter
Admission: RE | Disposition: A | Discharge status: Home / Self Care | Payer: Self-pay | Source: Ambulatory Visit | Attending: Orthopedic Surgery

## 2022-12-27 ENCOUNTER — Observation Stay: Payer: Medicare Other

## 2022-12-27 ENCOUNTER — Ambulatory Visit: Payer: Medicare Other | Admitting: Urgent Care

## 2022-12-27 ENCOUNTER — Observation Stay
Admission: RE | Admit: 2022-12-27 | Discharge: 2022-12-28 | Disposition: A | Discharge status: Home / Self Care | Payer: Medicare Other | Source: Ambulatory Visit | Attending: Orthopedic Surgery | Admitting: Orthopedic Surgery

## 2022-12-27 ENCOUNTER — Encounter: Payer: Self-pay | Admitting: Orthopedic Surgery

## 2022-12-27 DIAGNOSIS — R634 Abnormal weight loss: Secondary | ICD-10-CM

## 2022-12-27 DIAGNOSIS — M1612 Unilateral primary osteoarthritis, left hip: Secondary | ICD-10-CM | POA: Diagnosis present

## 2022-12-27 DIAGNOSIS — I10 Essential (primary) hypertension: Secondary | ICD-10-CM | POA: Insufficient documentation

## 2022-12-27 DIAGNOSIS — J45909 Unspecified asthma, uncomplicated: Secondary | ICD-10-CM | POA: Insufficient documentation

## 2022-12-27 DIAGNOSIS — Z85828 Personal history of other malignant neoplasm of skin: Secondary | ICD-10-CM | POA: Diagnosis not present

## 2022-12-27 DIAGNOSIS — Z79899 Other long term (current) drug therapy: Secondary | ICD-10-CM | POA: Diagnosis not present

## 2022-12-27 DIAGNOSIS — Z9884 Bariatric surgery status: Secondary | ICD-10-CM

## 2022-12-27 DIAGNOSIS — Z01812 Encounter for preprocedural laboratory examination: Secondary | ICD-10-CM

## 2022-12-27 DIAGNOSIS — Z96642 Presence of left artificial hip joint: Secondary | ICD-10-CM

## 2022-12-27 HISTORY — PX: TOTAL HIP ARTHROPLASTY: SHX124

## 2022-12-27 LAB — ABO/RH: ABO/RH(D): A POS

## 2022-12-27 SURGERY — ARTHROPLASTY, HIP, TOTAL,POSTERIOR APPROACH
Anesthesia: General | Site: Hip | Laterality: Left

## 2022-12-27 MED ORDER — TRANEXAMIC ACID-NACL 1000-0.7 MG/100ML-% IV SOLN
1000.0000 mg | Freq: Once | INTRAVENOUS | Status: AC
  Administered 2022-12-27: 1000 mg via INTRAVENOUS

## 2022-12-27 MED ORDER — TRANEXAMIC ACID-NACL 1000-0.7 MG/100ML-% IV SOLN
INTRAVENOUS | Status: AC
  Filled 2022-12-27: qty 100

## 2022-12-27 MED ORDER — POTASSIUM CHLORIDE CRYS ER 20 MEQ PO TBCR
EXTENDED_RELEASE_TABLET | ORAL | Status: AC
  Filled 2022-12-27: qty 1

## 2022-12-27 MED ORDER — PROMETHAZINE HCL 25 MG/ML IJ SOLN: 6.2500 mg | INTRAMUSCULAR | Status: DC | PRN

## 2022-12-27 MED ORDER — ACETAMINOPHEN 10 MG/ML IV SOLN: 1000.0000 mg | Freq: Once | INTRAVENOUS | Status: DC | PRN

## 2022-12-27 MED ORDER — ADULT MULTIVITAMIN W/MINERALS CH
ORAL_TABLET | ORAL | Status: AC
  Filled 2022-12-27: qty 2

## 2022-12-27 MED ORDER — CEFAZOLIN SODIUM-DEXTROSE 2-4 GM/100ML-% IV SOLN
INTRAVENOUS | Status: AC
  Filled 2022-12-27: qty 100

## 2022-12-27 MED ORDER — ASPIRIN 81 MG PO CHEW
CHEWABLE_TABLET | ORAL | Status: AC
  Filled 2022-12-27: qty 1

## 2022-12-27 MED ORDER — KETAMINE HCL 10 MG/ML IJ SOLN
INTRAMUSCULAR | Status: DC | PRN
  Administered 2022-12-27 (×4): 10 mg via INTRAVENOUS

## 2022-12-27 MED ORDER — ROSUVASTATIN CALCIUM 20 MG PO TABS
10.0000 mg | ORAL_TABLET | Freq: Every day | ORAL | Status: DC
  Administered 2022-12-27: 10 mg via ORAL

## 2022-12-27 MED ORDER — PANTOPRAZOLE SODIUM 40 MG PO TBEC
DELAYED_RELEASE_TABLET | ORAL | Status: AC
  Filled 2022-12-27: qty 1

## 2022-12-27 MED ORDER — ROCURONIUM BROMIDE 100 MG/10ML IV SOLN
INTRAVENOUS | Status: DC | PRN
  Administered 2022-12-27 (×3): 20 mg via INTRAVENOUS

## 2022-12-27 MED ORDER — OXYCODONE HCL 5 MG PO TABS
ORAL_TABLET | ORAL | Status: AC
  Filled 2022-12-27: qty 1

## 2022-12-27 MED ORDER — HYDROMORPHONE HCL 1 MG/ML IJ SOLN: 0.5000 mg | INTRAMUSCULAR | Status: DC | PRN

## 2022-12-27 MED ORDER — ALUM & MAG HYDROXIDE-SIMETH 200-200-20 MG/5ML PO SUSP: 30.0000 mL | ORAL | Status: DC | PRN

## 2022-12-27 MED ORDER — PHENOL 1.4 % MT LIQD: 1.0000 | OROMUCOSAL | Status: DC | PRN

## 2022-12-27 MED ORDER — FENTANYL CITRATE (PF) 100 MCG/2ML IJ SOLN
INTRAMUSCULAR | Status: AC
  Filled 2022-12-27: qty 2

## 2022-12-27 MED ORDER — FENTANYL CITRATE (PF) 100 MCG/2ML IJ SOLN
25.0000 ug | INTRAMUSCULAR | Status: DC | PRN
  Administered 2022-12-27 (×6): 25 ug via INTRAVENOUS

## 2022-12-27 MED ORDER — MAGNESIUM HYDROXIDE 400 MG/5ML PO SUSP
30.0000 mL | Freq: Every day | ORAL | Status: DC
  Administered 2022-12-27 – 2022-12-28 (×2): 30 mL via ORAL

## 2022-12-27 MED ORDER — SENNOSIDES-DOCUSATE SODIUM 8.6-50 MG PO TABS
ORAL_TABLET | ORAL | Status: AC
  Filled 2022-12-27: qty 1

## 2022-12-27 MED ORDER — CHLORHEXIDINE GLUCONATE 0.12 % MT SOLN
OROMUCOSAL | Status: AC
  Filled 2022-12-27: qty 15

## 2022-12-27 MED ORDER — FERROUS SULFATE 325 (65 FE) MG PO TABS: 325.0000 mg | ORAL_TABLET | Freq: Two times a day (BID) | ORAL | Status: DC

## 2022-12-27 MED ORDER — ACETAMINOPHEN 10 MG/ML IV SOLN
INTRAVENOUS | Status: AC
  Filled 2022-12-27: qty 100

## 2022-12-27 MED ORDER — PROPOFOL 10 MG/ML IV BOLUS
INTRAVENOUS | Status: DC | PRN
  Administered 2022-12-27: 150 mg via INTRAVENOUS

## 2022-12-27 MED ORDER — SODIUM CHLORIDE 0.9 % IV SOLN: INTRAVENOUS | Status: DC

## 2022-12-27 MED ORDER — MISOPROSTOL 200 MCG PO TABS
200.0000 ug | ORAL_TABLET | Freq: Four times a day (QID) | ORAL | Status: DC
  Administered 2022-12-27 – 2022-12-28 (×4): 200 ug via ORAL
  Filled 2022-12-27 (×5): qty 1

## 2022-12-27 MED ORDER — MIDAZOLAM HCL 2 MG/2ML IJ SOLN
INTRAMUSCULAR | Status: DC | PRN
  Administered 2022-12-27: 2 mg via INTRAVENOUS

## 2022-12-27 MED ORDER — DROPERIDOL 2.5 MG/ML IJ SOLN: 0.6250 mg | Freq: Once | INTRAMUSCULAR | Status: DC | PRN

## 2022-12-27 MED ORDER — DEXMEDETOMIDINE HCL IN NACL 200 MCG/50ML IV SOLN
INTRAVENOUS | Status: DC | PRN
  Administered 2022-12-27 (×4): 4 ug via INTRAVENOUS

## 2022-12-27 MED ORDER — MENTHOL 3 MG MT LOZG: 1.0000 | LOZENGE | OROMUCOSAL | Status: DC | PRN

## 2022-12-27 MED ORDER — MIDAZOLAM HCL 2 MG/2ML IJ SOLN
INTRAMUSCULAR | Status: AC
  Filled 2022-12-27: qty 2

## 2022-12-27 MED ORDER — SODIUM CHLORIDE 0.9 % IR SOLN
Status: DC | PRN
  Administered 2022-12-27: 3000 mL

## 2022-12-27 MED ORDER — ENSURE PRE-SURGERY PO LIQD
296.0000 mL | Freq: Once | ORAL | Status: AC
  Administered 2022-12-27: 296 mL via ORAL
  Filled 2022-12-27: qty 296

## 2022-12-27 MED ORDER — MAGNESIUM HYDROXIDE 400 MG/5ML PO SUSP
ORAL | Status: AC
  Filled 2022-12-27: qty 30

## 2022-12-27 MED ORDER — SUCCINYLCHOLINE CHLORIDE 200 MG/10ML IV SOSY
PREFILLED_SYRINGE | INTRAVENOUS | Status: DC | PRN
  Administered 2022-12-27: 100 mg via INTRAVENOUS

## 2022-12-27 MED ORDER — CYCLOSPORINE 0.05 % OP EMUL
1.0000 [drp] | Freq: Two times a day (BID) | OPHTHALMIC | Status: DC
  Administered 2022-12-27: 1 [drp] via OPHTHALMIC
  Filled 2022-12-27 (×3): qty 30

## 2022-12-27 MED ORDER — 0.9 % SODIUM CHLORIDE (POUR BTL) OPTIME
TOPICAL | Status: DC | PRN
  Administered 2022-12-27: 1000 mL

## 2022-12-27 MED ORDER — METHOCARBAMOL 500 MG PO TABS
750.0000 mg | ORAL_TABLET | Freq: Every day | ORAL | Status: DC
  Administered 2022-12-27: 750 mg via ORAL

## 2022-12-27 MED ORDER — LACTATED RINGERS IV SOLN: INTRAVENOUS | Status: DC | PRN

## 2022-12-27 MED ORDER — NORTRIPTYLINE HCL 10 MG PO CAPS
10.0000 mg | ORAL_CAPSULE | Freq: Every day | ORAL | Status: DC
  Administered 2022-12-27: 10 mg via ORAL
  Filled 2022-12-27: qty 1

## 2022-12-27 MED ORDER — KETAMINE HCL 50 MG/5ML IJ SOSY
PREFILLED_SYRINGE | INTRAMUSCULAR | Status: AC
  Filled 2022-12-27: qty 5

## 2022-12-27 MED ORDER — HYDROMORPHONE HCL 1 MG/ML IJ SOLN
INTRAMUSCULAR | Status: DC | PRN
  Administered 2022-12-27: .5 mg via INTRAVENOUS

## 2022-12-27 MED ORDER — PHENYLEPHRINE HCL-NACL 20-0.9 MG/250ML-% IV SOLN
INTRAVENOUS | Status: AC
  Filled 2022-12-27: qty 250

## 2022-12-27 MED ORDER — ORAL CARE MOUTH RINSE: 15.0000 mL | OROMUCOSAL | Status: DC | PRN

## 2022-12-27 MED ORDER — FENTANYL CITRATE (PF) 100 MCG/2ML IJ SOLN
INTRAMUSCULAR | Status: DC | PRN
  Administered 2022-12-27 (×2): 50 ug via INTRAVENOUS

## 2022-12-27 MED ORDER — SURGIRINSE WOUND IRRIGATION SYSTEM - OPTIME
TOPICAL | Status: DC | PRN
  Administered 2022-12-27: 450 mL via TOPICAL

## 2022-12-27 MED ORDER — FERROUS SULFATE 325 (65 FE) MG PO TABS
ORAL_TABLET | ORAL | Status: AC
  Filled 2022-12-27: qty 1

## 2022-12-27 MED ORDER — RISAQUAD PO CAPS
1.0000 | ORAL_CAPSULE | Freq: Every day | ORAL | Status: DC
  Administered 2022-12-27 – 2022-12-28 (×2): 1 via ORAL
  Filled 2022-12-27 (×2): qty 1

## 2022-12-27 MED ORDER — IPRATROPIUM BROMIDE 0.06 % NA SOLN: 2.0000 | Freq: Two times a day (BID) | NASAL | Status: DC | PRN

## 2022-12-27 MED ORDER — METHOCARBAMOL 500 MG PO TABS
ORAL_TABLET | ORAL | Status: AC
  Filled 2022-12-27: qty 2

## 2022-12-27 MED ORDER — OXYCODONE HCL 5 MG PO TABS: 10.0000 mg | ORAL_TABLET | ORAL | Status: DC | PRN

## 2022-12-27 MED ORDER — LIDOCAINE HCL (CARDIAC) PF 100 MG/5ML IV SOSY
PREFILLED_SYRINGE | INTRAVENOUS | Status: DC | PRN
  Administered 2022-12-27: 150 mg via INTRAVENOUS
  Administered 2022-12-27: 80 mg via INTRAVENOUS

## 2022-12-27 MED ORDER — PREGABALIN 50 MG PO CAPS
ORAL_CAPSULE | ORAL | Status: AC
  Filled 2022-12-27: qty 3

## 2022-12-27 MED ORDER — FLEET ENEMA 7-19 GM/118ML RE ENEM: 1.0000 | ENEMA | Freq: Once | RECTAL | Status: DC | PRN

## 2022-12-27 MED ORDER — PHENYLEPHRINE HCL-NACL 20-0.9 MG/250ML-% IV SOLN
INTRAVENOUS | Status: DC | PRN
  Administered 2022-12-27: 40 ug/min via INTRAVENOUS

## 2022-12-27 MED ORDER — ACETAMINOPHEN 325 MG PO TABS: 325.0000 mg | ORAL_TABLET | Freq: Four times a day (QID) | ORAL | Status: DC | PRN

## 2022-12-27 MED ORDER — ONDANSETRON HCL 4 MG PO TABS: 4.0000 mg | ORAL_TABLET | Freq: Four times a day (QID) | ORAL | Status: DC | PRN

## 2022-12-27 MED ORDER — ONDANSETRON HCL 4 MG/2ML IJ SOLN
INTRAMUSCULAR | Status: DC | PRN
  Administered 2022-12-27: 4 mg via INTRAVENOUS

## 2022-12-27 MED ORDER — SENNOSIDES-DOCUSATE SODIUM 8.6-50 MG PO TABS
1.0000 | ORAL_TABLET | Freq: Two times a day (BID) | ORAL | Status: DC
  Administered 2022-12-27 – 2022-12-28 (×3): 1 via ORAL

## 2022-12-27 MED ORDER — OXYCODONE HCL 5 MG/5ML PO SOLN: 5.0000 mg | Freq: Once | ORAL | Status: AC | PRN

## 2022-12-27 MED ORDER — ONDANSETRON HCL 4 MG/2ML IJ SOLN: 4.0000 mg | Freq: Four times a day (QID) | INTRAMUSCULAR | Status: DC | PRN

## 2022-12-27 MED ORDER — DEXAMETHASONE SODIUM PHOSPHATE 10 MG/ML IJ SOLN
INTRAMUSCULAR | Status: AC
  Filled 2022-12-27: qty 1

## 2022-12-27 MED ORDER — PROPOFOL 10 MG/ML IV BOLUS
INTRAVENOUS | Status: AC
  Filled 2022-12-27: qty 40

## 2022-12-27 MED ORDER — OXYCODONE HCL 5 MG PO TABS
5.0000 mg | ORAL_TABLET | Freq: Once | ORAL | Status: AC | PRN
  Administered 2022-12-27: 5 mg via ORAL

## 2022-12-27 MED ORDER — GABAPENTIN 300 MG PO CAPS
ORAL_CAPSULE | ORAL | Status: AC
  Filled 2022-12-27: qty 1

## 2022-12-27 MED ORDER — AMLODIPINE BESYLATE 5 MG PO TABS: 5.0000 mg | ORAL_TABLET | Freq: Every day | ORAL | Status: DC

## 2022-12-27 MED ORDER — PHENYLEPHRINE HCL (PRESSORS) 10 MG/ML IV SOLN
INTRAVENOUS | Status: DC | PRN
  Administered 2022-12-27 (×4): 80 ug via INTRAVENOUS
  Administered 2022-12-27 (×2): 160 ug via INTRAVENOUS

## 2022-12-27 MED ORDER — ASPIRIN 81 MG PO CHEW
81.0000 mg | CHEWABLE_TABLET | Freq: Two times a day (BID) | ORAL | Status: DC
  Administered 2022-12-27 – 2022-12-28 (×2): 81 mg via ORAL

## 2022-12-27 MED ORDER — PREGABALIN 50 MG PO CAPS
150.0000 mg | ORAL_CAPSULE | Freq: Two times a day (BID) | ORAL | Status: DC
  Administered 2022-12-27 – 2022-12-28 (×2): 150 mg via ORAL

## 2022-12-27 MED ORDER — ADULT MULTIVITAMIN W/MINERALS CH
2.0000 | ORAL_TABLET | Freq: Every day | ORAL | Status: DC
  Administered 2022-12-27 – 2022-12-28 (×2): 2 via ORAL

## 2022-12-27 MED ORDER — PANTOPRAZOLE SODIUM 40 MG PO TBEC
40.0000 mg | DELAYED_RELEASE_TABLET | Freq: Two times a day (BID) | ORAL | Status: DC
  Administered 2022-12-27 – 2022-12-28 (×3): 40 mg via ORAL

## 2022-12-27 MED ORDER — POTASSIUM CHLORIDE CRYS ER 20 MEQ PO TBCR
20.0000 meq | EXTENDED_RELEASE_TABLET | Freq: Two times a day (BID) | ORAL | Status: DC
  Administered 2022-12-27 – 2022-12-28 (×3): 20 meq via ORAL

## 2022-12-27 MED ORDER — OXYCODONE HCL 5 MG PO TABS
5.0000 mg | ORAL_TABLET | ORAL | Status: DC | PRN
  Administered 2022-12-27 – 2022-12-28 (×4): 5 mg via ORAL

## 2022-12-27 MED ORDER — DIPHENHYDRAMINE HCL 12.5 MG/5ML PO ELIX: 12.5000 mg | ORAL_SOLUTION | ORAL | Status: DC | PRN

## 2022-12-27 MED ORDER — ACETAMINOPHEN 10 MG/ML IV SOLN
1000.0000 mg | Freq: Four times a day (QID) | INTRAVENOUS | Status: DC
  Administered 2022-12-27 – 2022-12-28 (×3): 1000 mg via INTRAVENOUS
  Filled 2022-12-27: qty 100

## 2022-12-27 MED ORDER — ACETAMINOPHEN 10 MG/ML IV SOLN
INTRAVENOUS | Status: DC | PRN
  Administered 2022-12-27: 1000 mg via INTRAVENOUS

## 2022-12-27 MED ORDER — OYSTER SHELL CALCIUM/D3 500-5 MG-MCG PO TABS
1.0000 | ORAL_TABLET | Freq: Every day | ORAL | Status: DC
  Administered 2022-12-27 – 2022-12-28 (×2): 1 via ORAL
  Filled 2022-12-27 (×2): qty 1

## 2022-12-27 MED ORDER — CEFAZOLIN SODIUM-DEXTROSE 2-4 GM/100ML-% IV SOLN
2.0000 g | Freq: Four times a day (QID) | INTRAVENOUS | Status: AC
  Administered 2022-12-27 (×2): 2 g via INTRAVENOUS

## 2022-12-27 MED ORDER — SUGAMMADEX SODIUM 200 MG/2ML IV SOLN
INTRAVENOUS | Status: DC | PRN
  Administered 2022-12-27: 200 mg via INTRAVENOUS

## 2022-12-27 MED ORDER — BISACODYL 10 MG RE SUPP: 10.0000 mg | Freq: Every day | RECTAL | Status: DC | PRN

## 2022-12-27 MED ORDER — HYDROMORPHONE HCL 1 MG/ML IJ SOLN
INTRAMUSCULAR | Status: AC
  Filled 2022-12-27: qty 1

## 2022-12-27 MED ORDER — ROSUVASTATIN CALCIUM 20 MG PO TABS
ORAL_TABLET | ORAL | Status: AC
  Filled 2022-12-27: qty 1

## 2022-12-27 MED ORDER — TRAMADOL HCL 50 MG PO TABS: 50.0000 mg | ORAL_TABLET | ORAL | Status: DC | PRN

## 2022-12-27 SURGICAL SUPPLY — 54 items
BLADE SAW 90X25X1.19 OSCILLAT (BLADE) ×1 IMPLANT
BRUSH SCRUB EZ PLAIN DRY (MISCELLANEOUS) ×1 IMPLANT
DRAPE 3/4 80X56 (DRAPES) ×1 IMPLANT
DRAPE INCISE IOBAN 66X60 STRL (DRAPES) ×1 IMPLANT
DRSG AQUACEL AG ADV 3.5X14 (GAUZE/BANDAGES/DRESSINGS) ×1 IMPLANT
DRSG MEPILEX SACRM 8.7X9.8 (GAUZE/BANDAGES/DRESSINGS) ×1 IMPLANT
DRSG NON-ADHERENT DERMACEA 3X4 (GAUZE/BANDAGES/DRESSINGS) ×1 IMPLANT
DRSG TEGADERM 4X4.75 (GAUZE/BANDAGES/DRESSINGS) ×1 IMPLANT
DURAPREP 26ML APPLICATOR (WOUND CARE) ×2 IMPLANT
ELECT CAUTERY BLADE 6.4 (BLADE) ×1 IMPLANT
ELECT REM PT RETURN 9FT ADLT (ELECTROSURGICAL) ×1
ELECTRODE REM PT RTRN 9FT ADLT (ELECTROSURGICAL) ×1 IMPLANT
GLOVE BIOGEL M STRL SZ7.5 (GLOVE) ×4 IMPLANT
GLOVE SRG 8 PF TXTR STRL LF DI (GLOVE) ×2 IMPLANT
GLOVE SURG UNDER POLY LF SZ8 (GLOVE) ×2
GOWN STRL REUS W/ TWL LRG LVL3 (GOWN DISPOSABLE) ×2 IMPLANT
GOWN STRL REUS W/ TWL XL LVL3 (GOWN DISPOSABLE) ×1 IMPLANT
GOWN STRL REUS W/TWL LRG LVL3 (GOWN DISPOSABLE) ×2
GOWN STRL REUS W/TWL XL LVL3 (GOWN DISPOSABLE) ×1
GOWN TOGA ZIPPER T7+ PEEL AWAY (MISCELLANEOUS) ×1 IMPLANT
HANDLE YANKAUER SUCT BULB TIP (MISCELLANEOUS) IMPLANT
HANDLE YANKAUER SUCT OPEN TIP (MISCELLANEOUS) ×1 IMPLANT
HEAD FEM STD 32X+1 STRL (Hips) IMPLANT
HEMOVAC 400CC 10FR (MISCELLANEOUS) ×1 IMPLANT
HOLDER FOLEY CATH W/STRAP (MISCELLANEOUS) ×1 IMPLANT
HOOD PEEL AWAY T7 (MISCELLANEOUS) ×1 IMPLANT
IV NS IRRIG 3000ML ARTHROMATIC (IV SOLUTION) ×1 IMPLANT
KIT PEG BOARD PINK (KITS) ×1 IMPLANT
KIT TURNOVER KIT A (KITS) ×1 IMPLANT
LINER ACET PNNCL PLUS4 NEUTRAL (Hips) IMPLANT
MANIFOLD NEPTUNE II (INSTRUMENTS) ×2 IMPLANT
NS IRRIG 500ML POUR BTL (IV SOLUTION) ×1 IMPLANT
PACK HIP PROSTHESIS (MISCELLANEOUS) ×1 IMPLANT
PIN SECT CUP 50MM (Hips) IMPLANT
PINNACLE PLUS 4 NEUTRAL (Hips) ×1 IMPLANT
PULSAVAC PLUS IRRIG FAN TIP (DISPOSABLE) ×1
SOL PREP PVP 2OZ (MISCELLANEOUS) ×1
SOLUTION IRRIG SURGIPHOR (IV SOLUTION) ×1 IMPLANT
SOLUTION PREP PVP 2OZ (MISCELLANEOUS) ×1 IMPLANT
SPONGE DRAIN TRACH 4X4 STRL 2S (GAUZE/BANDAGES/DRESSINGS) ×1 IMPLANT
STAPLER SKIN PROX 35W (STAPLE) ×1 IMPLANT
STEM FEMORAL SZ5 HIGH ACTIS (Stem) IMPLANT
SUT ETHIBOND #5 BRAIDED 30INL (SUTURE) ×1 IMPLANT
SUT VIC AB 0 CT1 36 (SUTURE) ×2 IMPLANT
SUT VIC AB 1 CT1 36 (SUTURE) ×2 IMPLANT
SUT VIC AB 2-0 CT1 27 (SUTURE) ×1
SUT VIC AB 2-0 CT1 TAPERPNT 27 (SUTURE) ×1 IMPLANT
TAPE CLOTH 3X10 WHT NS LF (GAUZE/BANDAGES/DRESSINGS) ×1 IMPLANT
TIP FAN IRRIG PULSAVAC PLUS (DISPOSABLE) ×1 IMPLANT
TOWEL OR 17X26 4PK STRL BLUE (TOWEL DISPOSABLE) IMPLANT
TRAP FLUID SMOKE EVACUATOR (MISCELLANEOUS) ×1 IMPLANT
TRAY FOLEY MTR SLVR 16FR STAT (SET/KITS/TRAYS/PACK) ×1 IMPLANT
TUBING CONNECTING 10 (TUBING) IMPLANT
WATER STERILE IRR 1000ML POUR (IV SOLUTION) ×1 IMPLANT

## 2022-12-27 NOTE — Evaluation (Signed)
Physical Therapy Evaluation Patient Details Name: Stephanie Jennings MRN: [id number] DOB: Jun 12, 1951 Today's Date: 12/27/2022  History of Present Illness  Patient is a 72 y.o female s/p L THA.  Clinical Impression  Pt is a pleasant 72 year old female who was admitted s/p Left THA. Pt performs bed mobility, transfers, and ambulation with min assist, RW needed for sit<>stand.Patient ambulated 80 ft with min assist and RW. Patient stated that she has sciatica with sharp stinging pain down LLE. Tested dermatomal patterns and was [facility] b/l.  Pt demonstrates deficits with mobility, ROM, strength, sensation, balance, DME knowledge, and knowledge of precautions. Pt will continue to receive skilled PT services while admitted and will defer to TOC/care team for updates regarding disposition planning.        Recommendations for follow up therapy are one component of a multi-disciplinary discharge planning process, led by the attending physician.  Recommendations may be updated based on patient status, additional functional criteria and insurance authorization.  Follow Up Recommendations       Assistance Recommended at Discharge Intermittent Supervision/Assistance  Patient can return home with the following  A little help with walking and/or transfers;A little help with bathing/dressing/bathroom;Assistance with cooking/housework;Assist for transportation;Help with stairs or ramp for entrance    Equipment Recommendations None recommended by PT  Recommendations for Other Services       Functional Status Assessment Patient has had a recent decline in their functional status and demonstrates the ability to make significant improvements in function in a reasonable and predictable amount of time.     Precautions / Restrictions Precautions Precautions: Posterior Hip Precaution Booklet Issued: No Precaution Comments: No adduction, IR, bending past 90 Restrictions Weight Bearing Restrictions: Yes LLE Weight  Bearing: Weight bearing as tolerated Other Position/Activity Restrictions: no L Hip adduction, IR, bending past 90      Mobility  Bed Mobility Overal bed mobility: Needs Assistance Bed Mobility: Supine to Sit     Supine to sit: Min assist     General bed mobility comments: Patient requires assistance to scoot legs off bed. Able to rotate upper body and scoot with UE's.    Transfers Overall transfer level: Needs assistance Equipment used: Rolling walker (2 wheels) Transfers: Sit to/from Stand Sit to Stand: Min assist           General transfer comment: Patient able to push through RLE to come to standing with RW min assist. cueing given for sequencing.    Ambulation/Gait Ambulation/Gait assistance: Min assist Gait Distance (Feet): 80 Feet Assistive device: Rolling walker (2 wheels) Gait Pattern/deviations: Step-to pattern, Step-through pattern, Decreased step length - right, Decreased stance time - left       General Gait Details: Variable gait pattern when walking. Alternates between a step through and step to gait pattern. Decreased stance time on LLE and decreased step w/ RLE. VC given for sequencing of steps and walker movement.  Stairs            Wheelchair Mobility    Modified Rankin (Stroke Patients Only)       Balance Overall balance assessment: Needs assistance Sitting-balance support: Bilateral upper extremity supported, Feet supported Sitting balance-Leahy Scale: Good     Standing balance support: Bilateral upper extremity supported, During functional activity, Reliant on assistive device for balance Standing balance-Leahy Scale: Good Standing balance comment: able to lateral weight shift with b/l UE support on RW and min assist.  Pertinent Vitals/Pain Pain Assessment Pain Assessment: 0-10 Pain Score: 5  Pain Intervention(s): Limited activity within patient's tolerance, Monitored during session,  Repositioned, Ice applied    Home Living Family/patient expects to be discharged to:: Private residence Living Arrangements: Spouse/significant other Available Help at Discharge: Family;Available 24 hours/day Type of Home: House Home Access: Stairs to enter;Ramped entrance Entrance Stairs-Rails: Left Entrance Stairs-Number of Steps: 4   Home Layout: One level Home Equipment: Agricultural consultant (2 wheels);Rollator (4 wheels);Cane - single point;BSC/3in1;Shower seat - built in;Grab bars - toilet;Grab bars - tub/shower Additional Comments: 2 steps to enter in the garage with no rails    Prior Function Prior Level of Function : Independent/Modified Independent             Mobility Comments: Patient used a SPC when walking large distances. ADLs Comments: independent in all activities.     Hand Dominance        Extremity/Trunk Assessment   Upper Extremity Assessment Upper Extremity Assessment: Overall WFL for tasks assessed    Lower Extremity Assessment Lower Extremity Assessment: LLE deficits/detail LLE Deficits / Details: Patient stated that she has sciatica in her LLE that radiates down the lateral calf. Pain described as sharp needles. Was previously seeing PT before her surgery for this problem. LLE Sensation: history of peripheral neuropathy LLE Coordination: WNL       Communication   Communication: No difficulties  Cognition Arousal/Alertness: Awake/alert Behavior During Therapy: WFL for tasks assessed/performed Overall Cognitive Status: Within Functional Limits for tasks assessed                                          General Comments      Exercises     Assessment/Plan    PT Assessment Patient needs continued PT services  PT Problem List Decreased strength;Decreased range of motion;Decreased balance;Decreased mobility;Decreased knowledge of use of DME;Decreased knowledge of precautions;Pain       PT Treatment Interventions DME  instruction;Gait training;Stair training;Functional mobility training;Therapeutic activities;Therapeutic exercise;Balance training;Patient/family education    PT Goals (Current goals can be found in the Care Plan section)  Acute Rehab PT Goals Patient Stated Goal: To go home PT Goal Formulation: With patient/family Time For Goal Achievement: 01/10/23 Potential to Achieve Goals: Good    Frequency BID     Co-evaluation               AM-PAC PT "6 Clicks" Mobility  Outcome Measure Help needed turning from your back to your side while in a flat bed without using bedrails?: A Little Help needed moving from lying on your back to sitting on the side of a flat bed without using bedrails?: A Little Help needed moving to and from a bed to a chair (including a wheelchair)?: A Little Help needed standing up from a chair using your arms (e.g., wheelchair or bedside chair)?: A Little Help needed to walk in hospital room?: A Little Help needed climbing 3-5 steps with a railing? : A Little 6 Click Score: 18    End of Session Equipment Utilized During Treatment: Gait belt Activity Tolerance: Patient tolerated treatment well Patient left: in chair;with call bell/phone within reach Nurse Communication: Mobility status PT Visit Diagnosis: Unsteadiness on feet (R26.81);Other abnormalities of gait and mobility (R26.89);Muscle weakness (generalized) (M62.81);Difficulty in walking, not elsewhere classified (R26.2);Pain Pain - Right/Left: Left Pain - part of body: Hip    Time:  1610-9604 PT Time Calculation (min) (ACUTE ONLY): 30 min   Charges:              Malachi Carl, SPT   Malachi Carl 12/27/2022, 3:33 PM

## 2022-12-27 NOTE — H&P (Signed)
ORTHOPAEDIC HISTORY & PHYSICAL Stephanie Jennings, [place] - 12/16/2022 10:45 AM EDT Formatting of this note is different from the original. NAME: Stephanie Jennings H&P Date: 12/16/2022 Procedure Date: 12/27/2022  Chief Complaint: left hip pain  HPI Stephanie Jennings is a 72 y.o. female who has severe left hip pain and has failed conservative treatment including Tylenol, ambulatory aids, NSAID's and activity modification. She presents today with her husband and is utilizing a cane. She states that she has not had any improvement of her left hip and groin pain that has dramatically gotten worse over the last 3 months. She denies having any trauma or falls. Reports her pain being an 8/10 today. States she has a known history of back issues that will cause radicular symptoms - has previous spinal fusion. She has requested operative intervention for relief of her pain and symptoms. She reports not having any issues with anesthesia. Denies having any personal or family history of DVT. Patient denies having any cardiac or pulmonary issues. Has a hx of gastric bypass surgery.  Social Hx: Patient no longer works. She no longer smokes. Denies illicit drug use  Medications & Allergies Allergies: Allergies Allergen Reactions Nsaids (Non-Steroidal Anti-Inflammatory Drug) Other (See Comments) S/P gastric bypass surg  Home Medicines: Current Outpatient Medications on File Prior to Visit Medication Sig Dispense Refill amLODIPine (NORVASC) 5 MG tablet TAKE 1 TABLET DAILY 90 tablet 0 biotin 1 mg tablet Take 1,000 mg by mouth once daily. calcium citrate-vitamin D3 (CITRACAL + D PETITES) 200 mg calcium -250 unit Tab 400 mg 3 x daily denosumab (PROLIA) 60 mg/mL inj syringe Inject 1 mL (60 mg total) subcutaneously every 6 (six) months 1 mL 1 fluticasone propionate (FLONASE) 50 mcg/actuation nasal spray USE 2 SPRAYS IN EACH NOSTRIL ONCE DAILY (Patient not taking: Reported on 12/16/2022) 48 g 3 ipratropium (ATROVENT)  0.06 % nasal spray Place 2 sprays into both nostrils 3 (three) times daily 45 mL 11 L. acidophilus/Bifid. animalis 10 billion cell Cap Take 1 capsule by mouth once daily lidocaine (LIDODERM) 5 % patch methocarbamol (ROBAXIN) 750 MG tablet Take by mouth miSOPROStoL (CYTOTEC) 200 MCG tablet Take 1 tablet (200 mcg total) by mouth 4 (four) times daily for 180 days 360 tablet 1 multivitamin tablet Take 2 tablets by mouth once daily nortriptyline (PAMELOR) 10 MG capsule Take by mouth omeprazole (PRILOSEC) 20 MG DR capsule TAKE 1 CAPSULE DAILY 90 capsule 0 potassium chloride (KLOR-CON) 10 MEQ ER tablet Take 10 mEq by mouth 4 (four) times daily pregabalin (LYRICA) 150 MG capsule 150 mg 3 (three) times daily RESTASIS 0.05 % ophthalmic emulsion rosuvastatin (CRESTOR) 10 MG tablet TAKE 1 TABLET DAILY 90 tablet 0 semaglutide (WEGOVY) 2.4 mg/0.75 mL pen injector Inject 0.75 mLs (2.4 mg total) subcutaneously once a week for 252 days 9 mL 2  No current facility-administered medications on file prior to visit.  Medical / Surgical History  Past Medical History: Diagnosis Date Abdominal pain of unknown etiology 2018 Upper abdomen and behind sternum Acid reflux 07//2018 Not present since on prilosec again since November Allergic state Arthritis Asthma without status asthmaticus (HHS-HCC) Cancer (CMS/HHS-HCC) melanoma lower left leg. 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 Repeat 10 YRS/ TKT anterior  spinal fussion CORNEAL EYE SURGERY Right Lasik for monovision ENDOSCOPIC CARPAL TUNNEL RELEASE Bilateral and tendon tranplant HYSTERECTOMY parathyroid adenoma removal   Physical Exam  Ht:160 cm (5\' 3" ) Wt:81.9 kg (180 lb 8 oz) BMI: Body mass index is 31.97 kg/m.  General/Constitutional: No apparent distress: well-nourished and well developed. Patient is anxious to discuss surgical intervention Eyes: Pupils equal, round with synchronous movement. Lymphatic: No palpable adenopathy. Respiratory: Patient has good chest wall movement with inspiration and expiration. Upon auscultation, patient does not have any wheezes, rhonchi or rails appreciated bilaterally. No crackles appreciated at the bases of the lungs Cardiovascular: Patient has a regular rate and rhythm without any appreciable murmurs, heaves, rubs or gallops. Distal posterior tibial pulses appreciated (2+). No edema, swelling or tenderness, except as noted in detailed exam. Homans' sign negative Integumentary: No impressive skin lesions present, except as noted in detailed exam. Neuro/Psych: Normal mood and affect, slightly anxious, oriented to person, place and time. Musculoskeletal: See left hip exam below  Left hip exam Patient has no noticeable deformity or skin changes of her hip noted on exam. Denies having much pain with palpation. Patient has noticeable reduced range of motion and pain with ROM testing with internal and external rotation. Able to get to about 110 degrees of flexion and 30 degrees of external rotation. There is difficulty getting past neutral with internal rotation and the patient reports a lot of pain in her groin with this movement. Strength with hip flexion 5/5. Straight leg raise negative. Patient neurovascularly intact to all lower leg dermatomes, posterior tibial pulses appreciated 2+.  Imaging Left hip imaging: None ordered today, the patient had recent films done on 11/16/2022. These films were reviewed,  there is noticeable arthritic changes appreciated on films with loss of joint space, spurring at both the inferior and superior aspects of the acetabulum. Does not appear to be any signs of AVN, lytic lesions, fractures or gross deformities.  Assesment and Plan CC:  I have recommended that Stephanie Jennings undergo left total hip arthroplasty. Consents has been signed. The risks, benefits, prognosis and alternatives including but not limited to DVT, PE, infection, neurovascular injury, failure of the procedure and death were explained to the patient and she is willing to proceed with surgery as described to her by myself. Plan will be for post operative admission of at least 1 midnight for pain control and PT. She will be managed with DVT prophylaxis, antibiotics postoperatively for 24 hours and aggressive in patient rehab.  Pre, intra and post op interventions were discussed. Patient asked questions that were all answered. Patient and husband have good understanding  Medication Reconciliation was performed. Discussed cessation of NSAIDs, Wegovy, vitamins and supplements.  A total of 50 minutes was spent reviewing patient's charts, medical reconciliation, discussing/educating the patient about surgical interventions, and answering any questions provided by the patient.  Patient cleared for surgery  JOSHUA Kendrick Fries, PA [facility] 12/16/2022 Electronically signed by Stephanie Rover, PA at 12/16/2022 5:12 PM EDT

## 2022-12-27 NOTE — Transfer of Care (Signed)
Immediate Anesthesia Transfer of Care Note  Patient: Stephanie Jennings  Procedure(s) Performed: TOTAL HIP ARTHROPLASTY (Left: Hip)  Patient Location: PACU  Anesthesia Type:General  Level of Consciousness: awake  Airway & Oxygen Therapy: Patient Spontanous Breathing and Patient connected to nasal cannula oxygen  Post-op Assessment: Report given to RN  Post vital signs: Reviewed  Last Vitals:  Vitals Value Taken Time  BP 103/71 12/27/22 1045  Temp 36.1 C 12/27/22 1040  Pulse 77 12/27/22 1047  Resp 12 12/27/22 1047  SpO2 100 % 12/27/22 1047  Vitals shown include unvalidated device data.  Last Pain:  Vitals:   12/27/22 1040  TempSrc:   PainSc: Asleep         Complications: No notable events documented.

## 2022-12-27 NOTE — Anesthesia Procedure Notes (Signed)
Procedure Name: Intubation Date/Time: 12/27/2022 7:32 AM  Performed by: Merlene Pulling, CRNAPre-anesthesia Checklist: Patient identified, Patient being monitored, Timeout performed, Emergency Drugs available and Suction available Patient Re-evaluated:Patient Re-evaluated prior to induction Oxygen Delivery Method: Circle system utilized Preoxygenation: Pre-oxygenation with 100% oxygen Induction Type: IV induction Ventilation: Mask ventilation without difficulty Laryngoscope Size: 3 and McGraph Grade View: Grade I Tube type: Oral Tube size: 7.0 mm Number of attempts: 1 Airway Equipment and Method: Stylet Placement Confirmation: ETT inserted through vocal cords under direct vision, positive ETCO2 and breath sounds checked- equal and bilateral Secured at: 21 cm Tube secured with: Tape Dental Injury: Teeth and Oropharynx as per pre-operative assessment  Comments: 4cc of air in pilot balloon.

## 2022-12-27 NOTE — Interval H&P Note (Signed)
History and Physical Interval Note:  12/27/2022 6:19 AM  Stephanie Jennings  has presented today for surgery, with the diagnosis of PRIMARY OSTEOARTHRITIS OF LEFT HIP.Marland Kitchen  The various methods of treatment have been discussed with the patient and family. After consideration of risks, benefits and other options for treatment, the patient has consented to  Procedure(s): TOTAL HIP ARTHROPLASTY - RNFA (Left) as a surgical intervention.  The patient's history has been reviewed, patient examined, no change in status, stable for surgery.  I have reviewed the patient's chart and labs.  Questions were answered to the patient's satisfaction.     Graycee Greeson P Shekinah Pitones

## 2022-12-27 NOTE — Anesthesia Postprocedure Evaluation (Signed)
Anesthesia Post Note  Patient: Stephanie Jennings  Procedure(s) Performed: TOTAL HIP ARTHROPLASTY (Left: Hip)  Patient location during evaluation: PACU Anesthesia Type: General Level of consciousness: awake and alert Pain management: pain level controlled Vital Signs Assessment: post-procedure vital signs reviewed and stable Respiratory status: spontaneous breathing, nonlabored ventilation, respiratory function stable and patient connected to nasal cannula oxygen Cardiovascular status: blood pressure returned to baseline and stable Postop Assessment: no apparent nausea or vomiting Anesthetic complications: no   No notable events documented.   Last Vitals:  Vitals:   12/27/22 1050 12/27/22 1100  BP:    Pulse: 79 77  Resp: 17 17  Temp:    SpO2: 99% 100%    Last Pain:  Vitals:   12/27/22 1100  TempSrc:   PainSc: 10-Worst pain ever                 Yevette Edwards

## 2022-12-27 NOTE — Op Note (Signed)
OPERATIVE NOTE  DATE OF SURGERY:  12/27/2022  PATIENT NAME:  Stephanie Jennings   DOB: March 05, 1951  MRN: [id number]  PRE-OPERATIVE DIAGNOSIS: Degenerative arthrosis of the left hip, primary  POST-OPERATIVE DIAGNOSIS:  Same  PROCEDURE:  Left total hip arthroplasty  SURGEON:  Jena Gauss. M.D.  ANESTHESIA: general  ESTIMATED BLOOD LOSS: 100 mL  FLUIDS REPLACED: 1000 mL of crystalloid  DRAINS: 2 medium Hemovac drains  IMPLANTS UTILIZED: DePuy size 5 high offset Actis femoral stem, 50 mm OD Pinnacle 100 acetabular component, +4 mm neutral Pinnacle Altrx polyethylene insert, and a 32 mm CoCr +1 mm hip ball  INDICATIONS FOR SURGERY: Stephanie Jennings is a 72 y.o. year old female with a long history of progressive hip and groin  pain. X-rays demonstrated severe degenerative changes. The patient had not seen any significant improvement despite conservative nonsurgical intervention. After discussion of the risks and benefits of surgical intervention, the patient expressed understanding of the risks benefits and agree with plans for total hip arthroplasty.   The risks, benefits, and alternatives were discussed at length including but not limited to the risks of infection, bleeding, nerve injury, stiffness, blood clots, the need for revision surgery, limb length inequality, dislocation, cardiopulmonary complications, among others, and they were willing to proceed.  PROCEDURE IN DETAIL: The patient was brought into the operating room and, after adequate general anesthesia was achieved, the patient was placed in a right lateral decubitus position. Axillary roll was placed and all bony prominences were well-padded. The patient's left hip was cleaned and prepped with alcohol and DuraPrep and draped in the usual sterile fashion. A "timeout" was performed as per usual protocol. A lateral curvilinear incision was made gently curving towards the posterior superior iliac spine. The IT band was incised in  line with the skin incision and the fibers of the gluteus maximus were split in line. The piriformis tendon was identified, skeletonized, and incised at its insertion to the proximal femur and reflected posteriorly. A T type posterior capsulotomy was performed. Prior to dislocation of the femoral head, a threaded Steinmann pin was inserted through a separate stab incision into the pelvis superior to the acetabulum and bent in the form of a stylus so as to assess limb length and hip offset throughout the procedure. The femoral head was then dislocated posteriorly. Inspection of the femoral head demonstrated severe degenerative changes with full-thickness loss of articular cartilage. The femoral neck cut was performed using an oscillating saw. The anterior capsule was elevated off of the femoral neck using a periosteal elevator. Attention was then directed to the acetabulum. The remnant of the labrum was excised using electrocautery. Inspection of the acetabulum also demonstrated significant degenerative changes. The acetabulum was reamed in sequential fashion up to a 49 mm diameter. Good punctate bleeding bone was encountered. A 50 mm Pinnacle 100 acetabular component was positioned and impacted into place. Good scratch fit was appreciated. A +4 mm neutral polyethylene trial was inserted.  Attention was then directed to the proximal femur.  Femoral broaches were inserted in a sequential fashion up to a size 5 broach. Calcar region was planed and a trial reduction was performed using a standard offset neck and a 32 mm hip ball with a +1 mm neck length.  Reasonably good stability was noted but there was slight decrease in the hip offset.  It was thus elected to trial with a high offset neck segment.  Good equalization of limb lengths and hip offset was appreciated  and excellent stability was noted both anteriorly and posteriorly. Trial components were removed. The acetabular shell was irrigated with copious amounts  of normal saline with antibiotic solution and suctioned dry. A +4 mm neutral Pinnacle Altrx polyethylene insert was positioned and impacted into place. Next, a size 5 high offset Actis femoral stem was positioned and impacted into place. Excellent scratch fit was appreciated. A trial reduction was again performed with a 32 mm hip ball with a +1 mm neck length. Again, good equalization of limb lengths was appreciated and excellent stability appreciated both anteriorly and posteriorly. The hip was then dislocated and the trial hip ball was removed. The Morse taper was cleaned and dried. A 32 mm cobalt chrome hip ball with a +1 mm neck length was placed on the trunnion and impacted into place. The hip was then reduced and placed through range of motion. Excellent stability was appreciated both anteriorly and posteriorly.  The wound was irrigated with copious amounts of normal saline followed by 450 ml of Surgiphor and suctioned dry. Good hemostasis was appreciated. The posterior capsulotomy was repaired using #5 Ethibond. Piriformis tendon was reapproximated to the undersurface of the gluteus medius tendon using #5 Ethibond. The IT band was reapproximated using interrupted sutures of #1 Vicryl. Subcutaneous tissue was approximated using first #0 Vicryl followed by #2-0 Vicryl. The skin was closed with skin staples.  The patient tolerated the procedure well and was transported to the recovery room in stable condition.   Jena Gauss., M.D.

## 2022-12-27 NOTE — Anesthesia Preprocedure Evaluation (Signed)
Anesthesia Evaluation  Patient identified by MRN, date of birth, ID band Patient awake    Reviewed: Allergy & Precautions, H&P , NPO status , Patient's Chart, lab work & pertinent test results, reviewed documented beta blocker date and time   Airway Mallampati: II  TM Distance: >3 FB Neck ROM: full    Dental  (+) Teeth Intact   Pulmonary neg shortness of breath, asthma , former smoker   Pulmonary exam normal        Cardiovascular Exercise Tolerance: Good hypertension, On Medications negative cardio ROS Normal cardiovascular exam Rhythm:regular Rate:Normal     Neuro/Psych Previous back surgery and difficulty with epidural steroid placement. Will proceed with GOT  Neuromuscular disease  negative psych ROS   GI/Hepatic Neg liver ROS,GERD  Medicated,,  Endo/Other  negative endocrine ROS    Renal/GU negative Renal ROS  negative genitourinary   Musculoskeletal   Abdominal   Peds  Hematology negative hematology ROS (+)   Anesthesia Other Findings Past Medical History: No date: Asthma No date: Cervical radiculopathy No date: Chest pain, atypical No date: GERD (gastroesophageal reflux disease) No date: Hypercholesterolemia No date: Hypertension, essential No date: Melanoma of lower leg, left (HCC) No date: Osteoarthritis No date: Osteoporosis No date: Pannus, abdominal No date: Spondylolisthesis of lumbar region Past Surgical History: 2020: ANKLE SURGERY; Left 2012: ANTERIOR FUSION CERVICAL SPINE     Comment:  C4,5,6 2017: CARPAL TUNNEL RELEASE; Left 2017: CARPAL TUNNEL RELEASE; Right 2018: CHOLECYSTECTOMY 02/18/2021: COLONOSCOPY WITH PROPOFOL; N/A     Comment:  Procedure: COLONOSCOPY WITH PROPOFOL;  Surgeon: [place],               Boykin Nearing, MD;  Location: [facility] ENDOSCOPY;  Service:               Gastroenterology;  Laterality: N/A; 12/15/2017: ESOPHAGOGASTRODUODENOSCOPY 2010: GASTRIC ROUX-EN-Y 1997:  LAPAROSCOPIC HYSTERECTOMY No date: LASIK; Right No date: PARATHYROIDECTOMY     Comment:  adenoma 08/2021: POSTERIOR LUMBAR FUSION     Comment:  L3,4 BMI    Body Mass Index: 32.06 kg/m     Reproductive/Obstetrics negative OB ROS                             Anesthesia Physical Anesthesia Plan  ASA: 2  Anesthesia Plan: General ETT   Post-op Pain Management:    Induction:   PONV Risk Score and Plan: 4 or greater  Airway Management Planned:   Additional Equipment:   Intra-op Plan:   Post-operative Plan:   Informed Consent: I have reviewed the patients History and Physical, chart, labs and discussed the procedure including the risks, benefits and alternatives for the proposed anesthesia with the patient or authorized representative who has indicated his/her understanding and acceptance.     Dental Advisory Given  Plan Discussed with: CRNA  Anesthesia Plan Comments:        Anesthesia Quick Evaluation

## 2022-12-27 NOTE — TOC Progression Note (Signed)
Transition of Care [facility]) - Progression Note    Patient Details  Name: Stephanie Jennings MRN: [id number] Date of Birth: [birth year]  [facility] CM/SW Contact  Marlowe Sax, RN Phone Number: 12/27/2022, 12:42 PM  Clinical Narrative:     The patient is set up with Home health thru the surgeons office prior to surgery with [facility]       Expected Discharge Plan and Services                                               Social Determinants of Health (SDOH) Interventions SDOH Screenings   Food Insecurity: No Food Insecurity (12/27/2022)  Housing: Low Risk  (12/27/2022)  Transportation Needs: No Transportation Needs (12/27/2022)  Utilities: Not At Risk (12/27/2022)  Tobacco Use: Medium Risk (12/27/2022)    Readmission Risk Interventions     No data to display

## 2022-12-27 NOTE — Discharge Summary (Signed)
Physician Discharge Summary  Patient ID: Stephanie Jennings MRN: [id number] DOB/AGE: 19-Dec-1950 72 y.o.  Admit date: 12/27/2022 Discharge date: 12/28/2022  Admission Diagnoses:  Hx of total hip arthroplasty, left [Z96.642] Degenerative arthrosis of the left hip.  Discharge Diagnoses: Patient Active Problem List   Diagnosis Date Noted   Hx of total hip arthroplasty, left 12/27/2022   Spondylolisthesis of lumbar region 08/13/2021   Cervical radiculopathy 12/18/2019   SGS (subglottic stenosis) 12/18/2019   Osteoporosis, post-menopausal 07/11/2019   Pannus, abdominal 04/17/2019   Skin laxity 04/17/2019   Weight loss 04/17/2019   Achilles tendinitis, left leg 08/15/2018   Gastrocnemius equinus of left lower extremity 08/15/2018   Atypical chest pain 04/03/2018   Essential hypertension 06/02/2015   H/O gastric bypass 06/02/2015   Chronic midline low back pain without sciatica 06/02/2015   Sciatic nerve disease, left 05/12/2014   Radiculopathy of lumbar region 07/12/2013   Cervical radicular pain 12/08/2012   Neck pain 12/08/2012   Osteoarthritis 07/12/1998    Past Medical History:  Diagnosis Date   Asthma    Cervical radiculopathy    Chest pain, atypical    GERD (gastroesophageal reflux disease)    Hypercholesterolemia    Hypertension, essential    Melanoma of lower leg, left (HCC)    Osteoarthritis    Osteoporosis    Pannus, abdominal    Spondylolisthesis of lumbar region      Transfusion: None.   Consultants (if any):   Discharged Condition: Improved  Hospital Course: Stephanie Jennings is an 72 y.o. female who was admitted 12/27/2022 with a diagnosis of degenerative arthrosis of the left hip and went to the operating room on 12/27/2022 and underwent the above named procedures.    Surgeries: Procedure(s): TOTAL HIP ARTHROPLASTY on 12/27/2022 Patient tolerated the surgery well. Taken to PACU where she was stabilized and then transferred to the orthopedic  floor.  Started on Aspirin 81mg  BID. Heels elevated on bed with rolled towels. No evidence of DVT. Negative Homan. Physical therapy started on day #1 for gait training and transfer. OT started day #1 for ADL and assisted devices.  Patient's IV and hemovac were removed on POD1.  Foley was removed shortly following surgery.  Implants: DePuy size 5 high offset Actis femoral stem, 50 mm OD Pinnacle 100 acetabular component, +4 mm neutral Pinnacle Altrx polyethylene insert, and a 32 mm CoCr +1 mm hip ball   She was given perioperative antibiotics:  Anti-infectives (From admission, onward)    Start     Dose/Rate Route Frequency Ordered Stop   12/27/22 1330  ceFAZolin (ANCEF) IVPB 2g/100 mL premix        2 g 200 mL/hr over 30 Minutes Intravenous Every 6 hours 12/27/22 1232 12/27/22 1918   12/27/22 0600  ceFAZolin (ANCEF) IVPB 2g/100 mL premix        2 g 200 mL/hr over 30 Minutes Intravenous On call to O.R. 12/26/22 2231 12/27/22 0743     .  She was given sequential compression devices, early ambulation, and Aspirin for DVT prophylaxis.  She benefited maximally from the hospital stay and there were no complications.    Recent vital signs:  Vitals:   12/27/22 2207 12/28/22 0418  BP: 125/60 116/69  Pulse: 99 94  Resp: 18 16  Temp: 99.6 F (37.6 C) 99.1 F (37.3 C)  SpO2: 95% 96%    Recent laboratory studies:  Lab Results  Component Value Date   HGB 13.2 12/14/2022   No results found for: "  WBC", "PLT" No results found for: "INR" Lab Results  Component Value Date   NA 141 12/14/2022   K 3.9 12/14/2022   CL 106 12/14/2022   CO2 26 12/14/2022   BUN 20 12/14/2022   CREATININE 0.71 12/14/2022   GLUCOSE 89 12/14/2022    Discharge Medications:   Allergies as of 12/28/2022       Reactions   Nsaids Other (See Comments)   Due to gastric bypass S/P gastric bypass surg        Medication List     TAKE these medications    acidophilus Caps capsule Take 1 capsule by  mouth daily.   amLODipine 5 MG tablet Commonly known as: NORVASC Take 5 mg by mouth daily.   aspirin 81 MG chewable tablet Chew 1 tablet (81 mg total) by mouth 2 (two) times daily.   bariatric multiple vitamin Chew chewable tablet Chew 2 tablets by mouth daily. No iron   CALCIUM SOFT CHEWS PO Take 3 tablets by mouth daily.   celecoxib 100 MG capsule Commonly known as: CELEBREX Take 100 mg by mouth 2 (two) times daily as needed.   cycloSPORINE 0.05 % ophthalmic emulsion Commonly known as: RESTASIS Place 1 drop into both eyes 2 (two) times daily.   denosumab 60 MG/ML Sosy injection Commonly known as: PROLIA Inject 60 mg into the skin every 6 (six) months.   HM LIDOCAINE PATCH EX Apply 1 patch topically daily as needed.   ipratropium 0.06 % nasal spray Commonly known as: ATROVENT Place 2 sprays into both nostrils 2 (two) times daily as needed for rhinitis.   melatonin 5 MG Tabs Take 5 mg by mouth at bedtime.   methocarbamol 750 MG tablet Commonly known as: ROBAXIN Take 750 mg by mouth at bedtime.   misoprostol 200 MCG tablet Commonly known as: CYTOTEC Take 200 mcg by mouth 4 (four) times daily.   nortriptyline 10 MG capsule Commonly known as: PAMELOR Take 10 mg by mouth at bedtime.   omeprazole 20 MG capsule Commonly known as: PRILOSEC Take 20 mg by mouth daily.   ondansetron 4 MG tablet Commonly known as: ZOFRAN Take 1 tablet (4 mg total) by mouth every 6 (six) hours as needed for nausea.   OVER THE COUNTER MEDICATION Take 2 capsules by mouth daily. Keranew nourishing hair complex   oxyCODONE 5 MG immediate release tablet Commonly known as: Oxy IR/ROXICODONE Take 1 tablet (5 mg total) by mouth every 4 (four) hours as needed for severe pain.   potassium chloride 10 MEQ tablet Commonly known as: KLOR-CON Take 2 tablets by mouth 2 (two) times daily.   pregabalin 150 MG capsule Commonly known as: LYRICA Take 150 mg by mouth in the morning, at noon, and  at bedtime.   rosuvastatin 10 MG tablet Commonly known as: CRESTOR Take 10 mg by mouth at bedtime.   Semaglutide-Weight Management 2.4 MG/0.75ML Soaj Inject 2.4 mg into the skin once a week. Saturday   traMADol 50 MG tablet Commonly known as: ULTRAM Take 1-2 tablets (50-100 mg total) by mouth every 4 (four) hours as needed for moderate pain.               Durable Medical Equipment  (From admission, onward)           Start     Ordered   12/27/22 1233  DME Walker rolling  Once       Question:  Patient needs a walker to treat with the following condition  Answer:  S/P total hip arthroplasty   12/27/22 1232   12/27/22 1233  DME Bedside commode  Once       Comments: Patient is not able to walk the distance required to go the bathroom, or he/she is unable to safely negotiate stairs required to access the bathroom.  A 3in1 BSC will alleviate this problem  Question:  Patient needs a bedside commode to treat with the following condition  Answer:  S/P total hip arthroplasty   12/27/22 1232            Diagnostic Studies: DG HIP UNILAT W OR W/O PELVIS 2-3 VIEWS LEFT  Result Date: 12/27/2022 CLINICAL DATA:  Left hip arthroplasty EXAM: DG HIP (WITH OR WITHOUT PELVIS) 2-3V LEFT COMPARISON:  None Available. FINDINGS: Postsurgical changes reflecting left hip arthroplasty are seen. Hardware alignment is within expected limits, without evidence of complication. Right hip alignment is normal. There is no significant degenerative change about the right hip. The SI joints and symphysis pubis are intact. The soft tissues are unremarkable. IMPRESSION: Status post left hip arthroplasty without evidence of complication. Electronically Signed   By: Lesia Hausen M.D.   On: 12/27/2022 11:55    Disposition: Plan for discharge home today pending progress with PT.   Follow-up Information     Hooten, Illene Labrador, MD Follow up on 02/08/2023.   Specialty: Orthopedic Surgery Why: at 1:45pm Contact  information: [address] [facility] [place] [address] [phone]                Signed: Meriel Pica PA-C 12/28/2022, 6:48 AM

## 2022-12-28 ENCOUNTER — Encounter: Payer: Self-pay | Admitting: Orthopedic Surgery

## 2022-12-28 DIAGNOSIS — M1612 Unilateral primary osteoarthritis, left hip: Secondary | ICD-10-CM | POA: Diagnosis not present

## 2022-12-28 MED ORDER — OXYCODONE HCL 5 MG PO TABS
ORAL_TABLET | ORAL | Status: AC
  Filled 2022-12-28: qty 1

## 2022-12-28 MED ORDER — MAGNESIUM HYDROXIDE 400 MG/5ML PO SUSP
ORAL | Status: AC
  Filled 2022-12-28: qty 30

## 2022-12-28 MED ORDER — SENNOSIDES-DOCUSATE SODIUM 8.6-50 MG PO TABS
ORAL_TABLET | ORAL | Status: AC
  Filled 2022-12-28: qty 1

## 2022-12-28 MED ORDER — ASPIRIN 81 MG PO CHEW
CHEWABLE_TABLET | ORAL | Status: AC
  Filled 2022-12-28: qty 1

## 2022-12-28 MED ORDER — ACETAMINOPHEN 10 MG/ML IV SOLN
INTRAVENOUS | Status: AC
  Filled 2022-12-28: qty 100

## 2022-12-28 MED ORDER — ADULT MULTIVITAMIN W/MINERALS CH
ORAL_TABLET | ORAL | Status: AC
  Filled 2022-12-28: qty 1

## 2022-12-28 MED ORDER — OXYCODONE HCL 5 MG PO TABS: 5.0000 mg | ORAL_TABLET | ORAL | 0 refills | Status: AC | PRN

## 2022-12-28 MED ORDER — ONDANSETRON HCL 4 MG PO TABS: 4.0000 mg | ORAL_TABLET | Freq: Four times a day (QID) | ORAL | 0 refills | Status: AC | PRN

## 2022-12-28 MED ORDER — PREGABALIN 50 MG PO CAPS
ORAL_CAPSULE | ORAL | Status: AC
  Filled 2022-12-28: qty 3

## 2022-12-28 MED ORDER — TRAMADOL HCL 50 MG PO TABS: 50.0000 mg | ORAL_TABLET | ORAL | 0 refills | Status: AC | PRN

## 2022-12-28 MED ORDER — PANTOPRAZOLE SODIUM 40 MG PO TBEC
DELAYED_RELEASE_TABLET | ORAL | Status: AC
  Filled 2022-12-28: qty 1

## 2022-12-28 MED ORDER — ASPIRIN 81 MG PO CHEW: 81.0000 mg | CHEWABLE_TABLET | Freq: Two times a day (BID) | ORAL | 0 refills | Status: AC

## 2022-12-28 MED ORDER — FERROUS SULFATE 325 (65 FE) MG PO TABS
ORAL_TABLET | ORAL | Status: AC
  Filled 2022-12-28: qty 1

## 2022-12-28 MED ORDER — POTASSIUM CHLORIDE CRYS ER 20 MEQ PO TBCR
EXTENDED_RELEASE_TABLET | ORAL | Status: AC
  Filled 2022-12-28: qty 1

## 2022-12-28 MED ORDER — AMLODIPINE BESYLATE 5 MG PO TABS
ORAL_TABLET | ORAL | Status: AC
  Filled 2022-12-28: qty 1

## 2022-12-28 NOTE — Progress Notes (Signed)
Patient is not able to walk the distance required to go the bathroom, or he/she is unable to safely negotiate stairs required to access the bathroom.  A 3in1 BSC will alleviate this problem   Seaver Machia P. Sekai Nayak, Jr. M.D.  

## 2022-12-28 NOTE — Plan of Care (Signed)
  Problem: Activity: Goal: Ability to avoid complications of mobility impairment will improve Outcome: Progressing   Problem: Pain Management: Goal: Pain level will decrease with appropriate interventions Outcome: Progressing   Problem: Skin Integrity: Goal: Will show signs of wound healing Outcome: Progressing   

## 2022-12-28 NOTE — TOC Progression Note (Signed)
Transition of Care [facility]) - Progression Note    Patient Details  Name: Stephanie Jennings MRN: [id number] Date of Birth: [birth year]  [facility] CM/SW Contact  Marlowe Sax, RN Phone Number: 12/28/2022, 10:00 AM  Clinical Narrative:    The patient has Agricultural consultant (2 wheels);Rollator (4 wheels);Cane - single point;BSC/3in1;Shower seat - built in;Grab bars - toilet;Grab bars - tub/shower at home and will not need additional DME  Additional Comments: 2 steps to enter in the garage with no rails    Expected Discharge Plan: Home w Home Health Services Barriers to Discharge: No Barriers Identified  Expected Discharge Plan and Services         Expected Discharge Date: 12/28/22                                     Social Determinants of Health (SDOH) Interventions SDOH Screenings   Food Insecurity: No Food Insecurity (12/27/2022)  Housing: Low Risk  (12/27/2022)  Transportation Needs: No Transportation Needs (12/27/2022)  Utilities: Not At Risk (12/27/2022)  Tobacco Use: Medium Risk (12/27/2022)    Readmission Risk Interventions     No data to display

## 2022-12-28 NOTE — Progress Notes (Signed)
Physical Therapy Treatment Patient Details Name: Stephanie Jennings MRN: [id number] DOB: 10-12-1950 Today's Date: 12/28/2022   History of Present Illness Patient is a 72 y.o female s/p L THA.    PT Comments    Patient sitting in recliner with nursing in the room administering medications. Patient stated their L hip felt stiff and moderately painful. Performed seated there ex; LAQ;s LLE x 5, ankle pumps b/l x 5, Quad sets LLE x 5, and gluteal sets x 5. Sit<>stand min guard to RW. Ambulated 150 ft with RW min guard and chair follow. 1 standing rest break and 2 seated rest breaks. Navigated 4 steps with 1 rail b/l HHA, up with good and down with bad. Demonstrated step to pattern when ascending/descending. Returned to recliner post stairs and wheeled back to room. Left in recliner with call bell, husband and OT entering.    Recommendations for follow up therapy are one component of a multi-disciplinary discharge planning process, led by the attending physician.  Recommendations may be updated based on patient status, additional functional criteria and insurance authorization.  Follow Up Recommendations       Assistance Recommended at Discharge Intermittent Supervision/Assistance  Patient can return home with the following A little help with walking and/or transfers;A little help with bathing/dressing/bathroom;Assistance with cooking/housework;Assist for transportation;Help with stairs or ramp for entrance   Equipment Recommendations  None recommended by PT    Recommendations for Other Services       Precautions / Restrictions Precautions Precautions: Posterior Hip Precaution Booklet Issued: No Precaution Comments: No adduction, IR, bending past 90 Restrictions Weight Bearing Restrictions: Yes LLE Weight Bearing: Weight bearing as tolerated Other Position/Activity Restrictions: no L Hip adduction, IR, bending past 90     Mobility  Bed Mobility               General bed  mobility comments: seated in recliner chair    Transfers Overall transfer level: Needs assistance Equipment used: Rolling walker (2 wheels) Transfers: Sit to/from Stand Sit to Stand: Min guard           General transfer comment: Patient able to push through RLE to come to standing with RW min guard.    Ambulation/Gait Ambulation/Gait assistance: Min guard Gait Distance (Feet): 150 Feet Assistive device: Rolling walker (2 wheels) Gait Pattern/deviations: Step-through pattern, Decreased step length - right, Decreased stance time - left       General Gait Details: demonstrated step through pattern, slight vaulting when transitioning weight through LLE.   Stairs             Wheelchair Mobility    Modified Rankin (Stroke Patients Only)       Balance Overall balance assessment: Needs assistance Sitting-balance support: Bilateral upper extremity supported, Feet supported Sitting balance-Leahy Scale: Good     Standing balance support: Bilateral upper extremity supported, During functional activity, Reliant on assistive device for balance Standing balance-Leahy Scale: Good                              Cognition Arousal/Alertness: Awake/alert Behavior During Therapy: WFL for tasks assessed/performed Overall Cognitive Status: Within Functional Limits for tasks assessed                                          Exercises General Exercises - Lower Extremity Ankle Circles/Pumps: AROM,  Both, 5 reps, Seated Quad Sets: Left, 5 reps, Seated Gluteal Sets: AROM, 5 reps, Seated Long Arc Quad: Left, 5 reps, Seated, AROM    General Comments        Pertinent Vitals/Pain Pain Assessment Pain Assessment: 0-10 Pain Score: 5  Pain Location: sciatic pain Pain Descriptors / Indicators: Discomfort Pain Intervention(s): Limited activity within patient's tolerance, Monitored during session    Home Living Family/patient expects to be discharged  to:: Private residence Living Arrangements: Spouse/significant other Available Help at Discharge: Family;Available 24 hours/day Type of Home: House Home Access: Stairs to enter;Ramped entrance Entrance Stairs-Rails: Left Entrance Stairs-Number of Steps: 4   Home Layout: One level Home Equipment: Agricultural consultant (2 wheels);Rollator (4 wheels);Cane - single point;BSC/3in1;Shower seat - built in;Grab bars - toilet;Grab bars - tub/shower      Prior Function            PT Goals (current goals can now be found in the care plan section) Acute Rehab PT Goals Patient Stated Goal: To go home PT Goal Formulation: With patient/family Time For Goal Achievement: 01/10/23 Potential to Achieve Goals: Good Progress towards PT goals: Progressing toward goals    Frequency    BID      PT Plan Current plan remains appropriate    Co-evaluation              AM-PAC PT "6 Clicks" Mobility   Outcome Measure  Help needed turning from your back to your side while in a flat bed without using bedrails?: A Little Help needed moving from lying on your back to sitting on the side of a flat bed without using bedrails?: A Little Help needed moving to and from a bed to a chair (including a wheelchair)?: A Little Help needed standing up from a chair using your arms (e.g., wheelchair or bedside chair)?: A Little Help needed to walk in hospital room?: A Little Help needed climbing 3-5 steps with a railing? : A Little 6 Click Score: 18    End of Session Equipment Utilized During Treatment: Gait belt Activity Tolerance: Patient tolerated treatment well Patient left: in chair;with call bell/phone within reach;with family/visitor present Nurse Communication: Mobility status PT Visit Diagnosis: Unsteadiness on feet (R26.81);Other abnormalities of gait and mobility (R26.89);Muscle weakness (generalized) (M62.81);Difficulty in walking, not elsewhere classified (R26.2);Pain Pain - Right/Left: Left Pain -  part of body: Hip     Time: 2956-2130 PT Time Calculation (min) (ACUTE ONLY): 40 min  Charges:                        Malachi Carl, SPT    Malachi Carl 12/28/2022, 12:32 PM

## 2022-12-28 NOTE — Progress Notes (Signed)
DISCHARGE NOTE:  Pt given discharge instructions and verbalized understanding. TED hose on both legs, extra honeycomb dressing sent. Pt wheeled to car by staff, husband providing transportation.

## 2022-12-28 NOTE — Evaluation (Signed)
Occupational Therapy Evaluation Patient Details Name: Stephanie Jennings MRN: [id number] DOB: 1951/02/04 Today's Date: 12/28/2022   History of Present Illness Patient is a 72 y.o female s/p L THA.   Clinical Impression   Upon entering the room, pt seated in recliner chair with spouse present in room. Pt is agreeable to OT evaluation. Pt verbalized hip precautions. She reports living at home with significant other who can assist as needed. Pt has all needed equipment for discharge. OT educated pt on functional mobility and self care tasks while maintaining precautions. Pt needing assistance to thread clothing onto L LE but does report having LH reacher at home she can utilize for task. Pt stands for UB dressing and to don pull over shirt with supervision overall. She feels confident about discharge with no further questions at this time. OT to complete orders.      Recommendations for follow up therapy are one component of a multi-disciplinary discharge planning process, led by the attending physician.  Recommendations may be updated based on patient status, additional functional criteria and insurance authorization.   Assistance Recommended at Discharge Intermittent Supervision/Assistance  Patient can return home with the following A little help with bathing/dressing/bathroom;Assistance with cooking/housework;Assist for transportation;Help with stairs or ramp for entrance    Functional Status Assessment  Patient has had a recent decline in their functional status and demonstrates the ability to make significant improvements in function in a reasonable and predictable amount of time.  Equipment Recommendations  None recommended by OT       Precautions / Restrictions Precautions Precautions: Posterior Hip Restrictions Weight Bearing Restrictions: Yes LLE Weight Bearing: Weight bearing as tolerated      Mobility Bed Mobility               General bed mobility comments: seated in  recliner chair    Transfers Overall transfer level: Needs assistance Equipment used: Rolling walker (2 wheels) Transfers: Sit to/from Stand Sit to Stand: Supervision                  Balance Overall balance assessment: Needs assistance Sitting-balance support: Bilateral upper extremity supported, Feet supported       Standing balance support: Bilateral upper extremity supported, During functional activity, Reliant on assistive device for balance Standing balance-Leahy Scale: Good                             ADL either performed or assessed with clinical judgement   ADL Overall ADL's : Needs assistance/impaired                 Upper Body Dressing : Supervision/safety;Standing   Lower Body Dressing: Minimal assistance;Sit to/from stand                       Vision Patient Visual Report: No change from baseline              Pertinent Vitals/Pain Pain Assessment Pain Assessment: 0-10 Pain Score: 4  Pain Location: sciatic pain Pain Descriptors / Indicators: Discomfort Pain Intervention(s): Limited activity within patient's tolerance, Premedicated before session, Repositioned     Extremity/Trunk Assessment Upper Extremity Assessment Upper Extremity Assessment: Overall WFL for tasks assessed   Lower Extremity Assessment Lower Extremity Assessment: Defer to PT evaluation       Communication Communication Communication: No difficulties   Cognition Arousal/Alertness: Awake/alert Behavior During Therapy: WFL for tasks assessed/performed Overall Cognitive Status: Within Functional  Limits for tasks assessed                                                  Home Living Family/patient expects to be discharged to:: Private residence Living Arrangements: Spouse/significant other Available Help at Discharge: Family;Available 24 hours/day Type of Home: House Home Access: Stairs to enter;Ramped entrance Entrance  Stairs-Number of Steps: 4 Entrance Stairs-Rails: Left Home Layout: One level     Bathroom Shower/Tub: Producer, television/film/video: Handicapped height Bathroom Accessibility: No   Home Equipment: Agricultural consultant (2 wheels);Rollator (4 wheels);Cane - single point;BSC/3in1;Shower seat - built in;Grab bars - toilet;Grab bars - tub/shower          Prior Functioning/Environment Prior Level of Function : Independent/Modified Independent;Driving               ADLs Comments: Ind in self care and IADLs                 OT Goals(Current goals can be found in the care plan section) Acute Rehab OT Goals Patient Stated Goal: to go home OT Goal Formulation: With patient/family Time For Goal Achievement: 12/28/22 Potential to Achieve Goals: Fair  OT Frequency:         AM-PAC OT "6 Clicks" Daily Activity     Outcome Measure Help from another person eating meals?: None Help from another person taking care of personal grooming?: None Help from another person toileting, which includes using toliet, bedpan, or urinal?: None Help from another person bathing (including washing, rinsing, drying)?: A Little Help from another person to put on and taking off regular upper body clothing?: None Help from another person to put on and taking off regular lower body clothing?: A Little 6 Click Score: 22   End of Session Equipment Utilized During Treatment: Rolling walker (2 wheels) Nurse Communication: Mobility status  Activity Tolerance: Patient tolerated treatment well Patient left: in chair;with family/visitor present                   Time: 1004-1019 OT Time Calculation (min): 15 min Charges:  OT General Charges $OT Visit: 1 Visit OT Evaluation $OT Eval Low Complexity: 1 Low OT Treatments $Self Care/Home Management : 8-22 mins  Jackquline Denmark, MS, OTR/L , CBIS ascom [phone]  12/28/22, 11:03 AM

## 2022-12-28 NOTE — Progress Notes (Signed)
  Subjective: 1 Day Post-Op Procedure(s) (LRB): TOTAL HIP ARTHROPLASTY (Left) Patient reports pain as mild.   Patient is  well but does have history of sciatica which is inflamed by being in hospital bed currently. Plan is to go Home after hospital stay. Negative for chest pain and shortness of breath Fever: no Gastrointestinal:Negative for nausea and vomiting She is urinating well.  Reports passing some gas.  Objective: Vital signs in last 24 hours: Temp:  [97 F (36.1 C)-99.6 F (37.6 C)] 99.1 F (37.3 C) (06/18 0418) Pulse Rate:  [71-99] 94 (06/18 0418) Resp:  [10-18] 16 (06/18 0418) BP: (103-136)/(50-78) 116/69 (06/18 0418) SpO2:  [95 %-100 %] 96 % (06/18 0418)  Intake/Output from previous day:  Intake/Output Summary (Last 24 hours) at 12/28/2022 0643 Last data filed at 12/28/2022 0300 Gross per 24 hour  Intake 2418.62 ml  Output 466 ml  Net 1952.62 ml    Intake/Output this shift: Total I/O In: 1318.6 [I.V.:1022.5; IV Piggyback:296.2] Out: 110 [Drains:110]  Labs: No results for input(s): "HGB" in the last 72 hours. No results for input(s): "WBC", "RBC", "HCT", "PLT" in the last 72 hours. No results for input(s): "NA", "K", "CL", "CO2", "BUN", "CREATININE", "GLUCOSE", "CALCIUM" in the last 72 hours. No results for input(s): "LABPT", "INR" in the last 72 hours.   EXAM General - Patient is Alert, Appropriate, and Oriented Extremity - ABD soft Neurovascular intact Dorsiflexion/Plantar flexion intact Incision: dressing C/D/I No cellulitis present Compartment soft Dressing/Incision - clean, dry, no drainage noted to the left hip.  Hemovac removed this morning without issue. Motor Function - intact, moving foot and toes well on exam.  Abdomen soft with intact bowel sounds. Negative Homans to bilateral lower extremities.  Past Medical History:  Diagnosis Date   Asthma    Cervical radiculopathy    Chest pain, atypical    GERD (gastroesophageal reflux disease)     Hypercholesterolemia    Hypertension, essential    Melanoma of lower leg, left (HCC)    Osteoarthritis    Osteoporosis    Pannus, abdominal    Spondylolisthesis of lumbar region     Assessment/Plan: 1 Day Post-Op Procedure(s) (LRB): TOTAL HIP ARTHROPLASTY (Left) Principal Problem:   Hx of total hip arthroplasty, left  Estimated body mass index is 32.06 kg/m as calculated from the following:   Height as of this encounter: 5\' 3"  (1.6 m).   Weight as of this encounter: 82.1 kg. Advance diet Up with therapy D/C IV fluids when tolerating po intake.  Vitals reviewed this AM. Hemovac removed this morning without issue. Up with therapy today. Continue to work on BM. Plan for discharge home today pending progress with PT.   DVT Prophylaxis - Aspirin and TED hose Weight-Bearing as tolerated to left leg  J. Horris Latino, PA-C [facility] Orthopa[facility] 12/28/2022, 6:43 AM
